# Patient Record
Sex: Female | Born: 1965 | Race: Asian | Hispanic: No | Smoking: Former smoker
Health system: Southern US, Community
[De-identification: ages and names within clinical notes are randomized; demographics above are authoritative.]

## PROBLEM LIST (undated history)

## (undated) DIAGNOSIS — M479 Spondylosis, unspecified: Secondary | ICD-10-CM

## (undated) DIAGNOSIS — K2100 Gastro-esophageal reflux disease with esophagitis, without bleeding: Secondary | ICD-10-CM

## (undated) DIAGNOSIS — F32A Depression, unspecified: Secondary | ICD-10-CM

## (undated) DIAGNOSIS — I10 Essential (primary) hypertension: Secondary | ICD-10-CM

## (undated) DIAGNOSIS — M549 Dorsalgia, unspecified: Secondary | ICD-10-CM

## (undated) DIAGNOSIS — E785 Hyperlipidemia, unspecified: Secondary | ICD-10-CM

## (undated) DIAGNOSIS — G4700 Insomnia, unspecified: Secondary | ICD-10-CM

## (undated) DIAGNOSIS — T7840XA Allergy, unspecified, initial encounter: Secondary | ICD-10-CM

## (undated) DIAGNOSIS — F329 Major depressive disorder, single episode, unspecified: Secondary | ICD-10-CM

## (undated) DIAGNOSIS — K219 Gastro-esophageal reflux disease without esophagitis: Secondary | ICD-10-CM

## (undated) DIAGNOSIS — G8929 Other chronic pain: Secondary | ICD-10-CM

## (undated) HISTORY — DX: Spondylosis, unspecified: M47.9

## (undated) HISTORY — DX: Depression, unspecified: F32.A

## (undated) HISTORY — DX: Essential (primary) hypertension: I10

## (undated) HISTORY — DX: Other chronic pain: G89.29

## (undated) HISTORY — PX: TUBAL LIGATION: SHX77

## (undated) HISTORY — PX: BUNIONECTOMY: SHX129

## (undated) HISTORY — DX: Major depressive disorder, single episode, unspecified: F32.9

## (undated) HISTORY — DX: Hyperlipidemia, unspecified: E78.5

## (undated) HISTORY — DX: Dorsalgia, unspecified: M54.9

## (undated) HISTORY — DX: Gastro-esophageal reflux disease with esophagitis: K21.0

## (undated) HISTORY — PX: ABDOMINAL HYSTERECTOMY: SHX81

## (undated) HISTORY — DX: Gastro-esophageal reflux disease without esophagitis: K21.9

## (undated) HISTORY — DX: Gastro-esophageal reflux disease with esophagitis, without bleeding: K21.00

## (undated) HISTORY — DX: Insomnia, unspecified: G47.00

## (undated) HISTORY — DX: Allergy, unspecified, initial encounter: T78.40XA

---

## 2017-08-04 ENCOUNTER — Ambulatory Visit: Payer: Self-pay | Admitting: Family Medicine

## 2017-08-10 ENCOUNTER — Encounter: Payer: Self-pay | Admitting: Family Medicine

## 2017-08-10 ENCOUNTER — Ambulatory Visit
Admission: RE | Admit: 2017-08-10 | Discharge: 2017-08-10 | Disposition: A | Discharge status: Home / Self Care | Payer: BLUE CROSS/BLUE SHIELD | Source: Ambulatory Visit | Attending: Family Medicine | Admitting: Family Medicine

## 2017-08-10 ENCOUNTER — Ambulatory Visit (INDEPENDENT_AMBULATORY_CARE_PROVIDER_SITE_OTHER): Payer: BLUE CROSS/BLUE SHIELD | Admitting: Family Medicine

## 2017-08-10 VITALS — BP 140/88 | HR 72 | Ht 62.0 in | Wt 164.0 lb

## 2017-08-10 DIAGNOSIS — M5136 Other intervertebral disc degeneration, lumbar region: Secondary | ICD-10-CM | POA: Insufficient documentation

## 2017-08-10 DIAGNOSIS — M5442 Lumbago with sciatica, left side: Principal | ICD-10-CM

## 2017-08-10 DIAGNOSIS — G8929 Other chronic pain: Secondary | ICD-10-CM

## 2017-08-10 DIAGNOSIS — M5441 Lumbago with sciatica, right side: Principal | ICD-10-CM

## 2017-08-10 DIAGNOSIS — K219 Gastro-esophageal reflux disease without esophagitis: Secondary | ICD-10-CM | POA: Diagnosis not present

## 2017-08-10 DIAGNOSIS — M47896 Other spondylosis, lumbar region: Secondary | ICD-10-CM | POA: Diagnosis not present

## 2017-08-10 DIAGNOSIS — M160 Bilateral primary osteoarthritis of hip: Secondary | ICD-10-CM | POA: Diagnosis not present

## 2017-08-10 DIAGNOSIS — I10 Essential (primary) hypertension: Secondary | ICD-10-CM | POA: Diagnosis not present

## 2017-08-10 DIAGNOSIS — F32A Depression, unspecified: Secondary | ICD-10-CM

## 2017-08-10 DIAGNOSIS — Z7689 Persons encountering health services in other specified circumstances: Secondary | ICD-10-CM

## 2017-08-10 DIAGNOSIS — F329 Major depressive disorder, single episode, unspecified: Secondary | ICD-10-CM

## 2017-08-10 DIAGNOSIS — E785 Hyperlipidemia, unspecified: Secondary | ICD-10-CM | POA: Diagnosis not present

## 2017-08-10 MED ORDER — FENOFIBRATE 160 MG PO TABS: 160.0000 mg | ORAL_TABLET | Freq: Every day | ORAL | 1 refills | Status: DC

## 2017-08-10 MED ORDER — BUPROPION HCL ER (SR) 150 MG PO TB12: 150.0000 mg | ORAL_TABLET | Freq: Two times a day (BID) | ORAL | 1 refills | Status: DC

## 2017-08-10 MED ORDER — GABAPENTIN 300 MG PO CAPS: 300.0000 mg | ORAL_CAPSULE | Freq: Three times a day (TID) | ORAL | 1 refills | Status: DC

## 2017-08-10 MED ORDER — OMEPRAZOLE 40 MG PO CPDR: 40.0000 mg | DELAYED_RELEASE_CAPSULE | Freq: Every day | ORAL | 1 refills | Status: DC

## 2017-08-10 MED ORDER — LISINOPRIL 5 MG PO TABS: 5.0000 mg | ORAL_TABLET | Freq: Every day | ORAL | 1 refills | Status: DC

## 2017-08-10 NOTE — Progress Notes (Addendum)
Name: Joan Stein   MRN: [id number]    DOB: Jun 09, 1966   Date:08/11/2017       Progress Note  Subjective  Chief Complaint  Chief Complaint  Patient presents with  . Establish Care    new to area  . Back Pain    needs referral to ortho    Back Pain  This is a chronic problem. The current episode started more than 1 year ago (>20 years). The problem occurs constantly. The problem has been gradually improving since onset. The pain is present in the lumbar spine. The quality of the pain is described as aching. The pain radiates to the left knee, right knee, left thigh and right thigh. The pain is moderate. The symptoms are aggravated by bending. Associated symptoms include bladder incontinence, paresis and tingling. Pertinent negatives include no abdominal pain, bowel incontinence, chest pain, dysuria, fever, headaches, leg pain, numbness, paresthesias, pelvic pain, perianal numbness, weakness or weight loss. Risk factors include menopause. She has tried analgesics for the symptoms. The treatment provided moderate relief.  Depression         This is a chronic problem.  The current episode started more than 1 year ago.   The onset quality is sudden.   The problem occurs intermittently.  The problem has been gradually improving since onset.  Associated symptoms include fatigue, helplessness, hopelessness, insomnia and sad.  Associated symptoms include no decreased concentration, not irritable, no decreased interest, no myalgias and no headaches.  Past treatments include other medications (bupropion).  Compliance with treatment is good.  Previous treatment provided moderate relief. Hyperlipidemia  This is a chronic problem. Pertinent negatives include no chest pain, leg pain or myalgias. Current antihyperlipidemic treatment includes fibric acid derivatives. The current treatment provides moderate improvement of lipids. There are no compliance problems.   Hypertension  This is a chronic problem. The  current episode started more than 1 year ago. Associated symptoms include malaise/fatigue. Pertinent negatives include no chest pain, headaches, neck pain or palpitations. There are no associated agents to hypertension. Risk factors for coronary artery disease include dyslipidemia. Past treatments include ACE inhibitors. The current treatment provides moderate improvement. There are no compliance problems.  There is no history of angina, CAD/MI, CVA or heart failure.  Gastroesophageal Reflux  She complains of heartburn. She reports no abdominal pain or no chest pain. The problem has been waxing and waning. The symptoms are aggravated by certain foods (NSAID's). Associated symptoms include fatigue. Pertinent negatives include no anemia, melena or weight loss. She has tried a PPI for the symptoms. The treatment provided moderate relief.  Insomnia  Primary symptoms: no difficulty falling asleep, malaise/fatigue.  PMH includes: depression.    No problem-specific Assessment & Plan notes found for this encounter.   Past Medical History:  Diagnosis Date  . Allergy   . Chronic back pain   . Depression   . GERD (gastroesophageal reflux disease)   . Hyperlipidemia   . Hypertension   . Insomnia   . Reflux esophagitis   . Spondylosis     Past Surgical History:  Procedure Laterality Date  . ABDOMINAL HYSTERECTOMY    . BUNIONECTOMY    . TUBAL LIGATION      Family History  Problem Relation Age of Onset  . Diabetes Father   . Hypertension Father   . Diabetes Paternal Grandmother     Social History   Social History  . Marital status: Divorced    Spouse name: N/A  . Number of  children: N/A  . Years of education: N/A   Occupational History  . Not on file.   Social History Main Topics  . Smoking status: Former Smoker    Types: Cigarettes  . Smokeless tobacco: Never Used  . Alcohol use No  . Drug use: No  . Sexual activity: Yes   Other Topics Concern  . Not on file   Social  History Narrative  . No narrative on file    No Known Allergies  No outpatient prescriptions prior to visit.   No facility-administered medications prior to visit.     Review of Systems  Constitutional: Positive for fatigue and malaise/fatigue. Negative for fever and weight loss.  Cardiovascular: Negative for chest pain and palpitations.  Gastrointestinal: Positive for heartburn. Negative for abdominal pain, bowel incontinence and melena.  Genitourinary: Positive for bladder incontinence. Negative for dysuria and pelvic pain.  Musculoskeletal: Positive for back pain. Negative for myalgias and neck pain.  Neurological: Positive for tingling. Negative for weakness, numbness, headaches and paresthesias.  Psychiatric/Behavioral: Positive for depression. Negative for decreased concentration. The patient has insomnia.      Objective  Vitals:   08/10/17 1546  BP: 140/88  Pulse: 72  Weight: 164 lb (74.4 kg)  Height: 5\' 2"  (1.575 m)    Physical Exam  Constitutional: She is well-developed, well-nourished, and in no distress. She is not irritable. No distress.  HENT:  Head: Normocephalic and atraumatic.  Right Ear: External ear normal.  Left Ear: External ear normal.  Nose: Nose normal.  Mouth/Throat: Oropharynx is clear and moist.  Eyes: Pupils are equal, round, and reactive to light. Conjunctivae and EOM are normal. Right eye exhibits no discharge. Left eye exhibits no discharge.  Neck: Normal range of motion. Neck supple. No JVD present. No thyromegaly present.  Cardiovascular: Normal rate, regular rhythm, normal heart sounds and intact distal pulses.  Exam reveals no gallop and no friction rub.   No murmur heard. Pulmonary/Chest: Effort normal and breath sounds normal. She has no wheezes. She has no rales.  Abdominal: Soft. Bowel sounds are normal. She exhibits no mass. There is no tenderness. There is no guarding.  Musculoskeletal: Normal range of motion. She exhibits no  edema.  Lymphadenopathy:    She has no cervical adenopathy.  Neurological: She is alert.  Skin: Skin is warm and dry. She is not diaphoretic.  Psychiatric: Mood and affect normal.  Nursing note and vitals reviewed.     Assessment & Plan  Problem List Items Addressed This Visit    None    Visit Diagnoses    Establishing care with new doctor, encounter for    -  Primary   Essential hypertension       Relevant Medications   fenofibrate 160 MG tablet   lisinopril (PRINIVIL,ZESTRIL) 5 MG tablet   Other Relevant Orders   Renal Function Panel   Gastroesophageal reflux disease, esophagitis presence not specified       Relevant Medications   omeprazole (PRILOSEC) 40 MG capsule   Hyperlipidemia, unspecified hyperlipidemia type       Relevant Medications   fenofibrate 160 MG tablet   lisinopril (PRINIVIL,ZESTRIL) 5 MG tablet   Other Relevant Orders   Lipid Profile   Depression, unspecified depression type       Relevant Medications   buPROPion (WELLBUTRIN SR) 150 MG 12 hr tablet   Chronic midline low back pain with bilateral sciatica       return to current ortho doctor/if need go to  er for opiate   Relevant Medications   oxyCODONE-acetaminophen (PERCOCET) 10-325 MG tablet   ibuprofen (ADVIL,MOTRIN) 600 MG tablet   buPROPion (WELLBUTRIN SR) 150 MG 12 hr tablet   gabapentin (NEURONTIN) 300 MG capsule   cyclobenzaprine (FLEXERIL) 10 MG tablet   meloxicam (MOBIC) 15 MG tablet   Other Relevant Orders   DG Lumbar Spine Complete (Completed)   Ambulatory referral to Orthopedic Surgery      Meds ordered this encounter  Medications  . buPROPion (WELLBUTRIN SR) 150 MG 12 hr tablet    Sig: Take 1 tablet (150 mg total) by mouth 2 (two) times daily.    Dispense:  60 tablet    Refill:  1  . fenofibrate 160 MG tablet    Sig: Take 1 tablet (160 mg total) by mouth daily.    Dispense:  30 tablet    Refill:  1  . gabapentin (NEURONTIN) 300 MG capsule    Sig: Take 1 capsule (300 mg  total) by mouth 3 (three) times daily.    Dispense:  90 capsule    Refill:  1  . omeprazole (PRILOSEC) 40 MG capsule    Sig: Take 1 capsule (40 mg total) by mouth daily.    Dispense:  30 capsule    Refill:  1  . lisinopril (PRINIVIL,ZESTRIL) 5 MG tablet    Sig: Take 1 tablet (5 mg total) by mouth daily.    Dispense:  30 tablet    Refill:  1  . cyclobenzaprine (FLEXERIL) 10 MG tablet    Sig: Take 1 tablet (10 mg total) by mouth 2 (two) times daily.    Dispense:  30 tablet    Refill:  6  . meloxicam (MOBIC) 15 MG tablet    Sig: Take 1 tablet (15 mg total) by mouth daily.    Dispense:  30 tablet    Refill:  6      Dr. Elizabeth Sauer [facility]  08/11/17

## 2017-08-11 LAB — LIPID PANEL
CHOL/HDL RATIO: 5.5 ratio — AB (ref 0.0–4.4)
Cholesterol, Total: 240 mg/dL — ABNORMAL HIGH (ref 100–199)
HDL: 44 mg/dL (ref >39)
LDL CALC: 166 mg/dL — AB (ref 0–99)
TRIGLYCERIDES: 148 mg/dL (ref 0–149)
VLDL Cholesterol Cal: 30 mg/dL (ref 5–40)

## 2017-08-11 LAB — RENAL FUNCTION PANEL
Albumin: 4.6 g/dL (ref 3.5–5.5)
BUN / CREAT RATIO: 16 (ref 9–23)
BUN: 16 mg/dL (ref 6–24)
CALCIUM: 9.9 mg/dL (ref 8.7–10.2)
CO2: 24 mmol/L (ref 20–29)
CREATININE: 1.02 mg/dL — AB (ref 0.57–1.00)
Chloride: 103 mmol/L (ref 96–106)
GFR, EST AFRICAN AMERICAN: 74 mL/min/{1.73_m2} (ref >59)
GFR, EST NON AFRICAN AMERICAN: 64 mL/min/{1.73_m2} (ref >59)
Glucose: 102 mg/dL — ABNORMAL HIGH (ref 65–99)
Phosphorus: 4.2 mg/dL (ref 2.5–4.5)
Potassium: 4.5 mmol/L (ref 3.5–5.2)
SODIUM: 141 mmol/L (ref 134–144)

## 2017-08-11 MED ORDER — CYCLOBENZAPRINE HCL 10 MG PO TABS: 10.0000 mg | ORAL_TABLET | Freq: Two times a day (BID) | ORAL | 6 refills | Status: DC

## 2017-08-11 MED ORDER — MELOXICAM 15 MG PO TABS: 15.0000 mg | ORAL_TABLET | Freq: Every day | ORAL | 6 refills | Status: DC

## 2017-08-18 ENCOUNTER — Other Ambulatory Visit: Payer: Self-pay | Admitting: Sports Medicine

## 2017-08-18 DIAGNOSIS — M5136 Other intervertebral disc degeneration, lumbar region: Secondary | ICD-10-CM

## 2017-08-25 ENCOUNTER — Ambulatory Visit
Admission: RE | Admit: 2017-08-25 | Discharge: 2017-08-25 | Disposition: A | Discharge status: Home / Self Care | Payer: BLUE CROSS/BLUE SHIELD | Source: Ambulatory Visit | Attending: Sports Medicine | Admitting: Sports Medicine

## 2017-08-25 DIAGNOSIS — M48061 Spinal stenosis, lumbar region without neurogenic claudication: Secondary | ICD-10-CM | POA: Insufficient documentation

## 2017-08-25 DIAGNOSIS — M5136 Other intervertebral disc degeneration, lumbar region: Secondary | ICD-10-CM | POA: Insufficient documentation

## 2017-09-25 ENCOUNTER — Ambulatory Visit: Payer: BLUE CROSS/BLUE SHIELD | Admitting: Pain Medicine

## 2017-10-03 DIAGNOSIS — Z789 Other specified health status: Secondary | ICD-10-CM | POA: Insufficient documentation

## 2017-10-03 DIAGNOSIS — F1190 Opioid use, unspecified, uncomplicated: Secondary | ICD-10-CM | POA: Insufficient documentation

## 2017-10-03 DIAGNOSIS — M899 Disorder of bone, unspecified: Secondary | ICD-10-CM | POA: Insufficient documentation

## 2017-10-03 DIAGNOSIS — Z79899 Other long term (current) drug therapy: Secondary | ICD-10-CM | POA: Insufficient documentation

## 2017-10-03 DIAGNOSIS — Z791 Long term (current) use of non-steroidal anti-inflammatories (NSAID): Secondary | ICD-10-CM | POA: Insufficient documentation

## 2017-10-03 DIAGNOSIS — G894 Chronic pain syndrome: Secondary | ICD-10-CM | POA: Insufficient documentation

## 2017-10-03 NOTE — Progress Notes (Signed)
Patient's Name: Joan Stein  MRN: [id number]  Referring Provider: Diamond Nickel, MD  DOB: [birth year]  PCP: Juline Patch, MD  DOS: 10/04/2017  Note by: Gaspar Cola, MD  Service setting: Ambulatory outpatient  Specialty: Interventional Pain Management  Location: [facility] (AMB) Pain Management Facility  Visit type: Initial Patient Evaluation  Patient type: New Patient   Primary Reason(s) for Visit: Encounter for initial evaluation of one or more chronic problems (new to examiner) potentially causing chronic pain, and posing a threat to normal musculoskeletal function. (Level of risk: High) CC: Back Pain (low left)  HPI  Ms. Joan Stein is a 51 y.o. year old, female patient, who comes today to see [place] for the first time for an initial evaluation of her chronic pain. She has Chronic pain syndrome; Disorder of skeletal system; Pharmacologic therapy; Problems influencing health status; NSAID long-term use; Opiate use; Chronic low back pain (Primary Area of Pain) (Bilateral) (L>R); Chronic hip pain (Secondary Area of Pain) (Bilateral) (L>R); Chronic sacroiliac joint pain (Bilateral) (L>R); Lumbar facet syndrome (Bilateral) (L>R); Chronic thoracic spine pain (Midline); Chronic upper back (Midline); Long term prescription opiate use; Opioid-induced constipation (OIC); Neurogenic pain; and Chronic musculoskeletal pain on their problem list. Today she comes in for evaluation of her Back Pain (low left)  Pain Assessment: Location: Lower, Left Back Radiating: radiates down left leg on the side to knee Onset: More than a month ago Duration: Chronic pain Quality: Aching, Throbbing, Constant Severity: 2 /10 (self-reported pain score)  Note: Reported level is compatible with observation.                         When using our objective Pain Scale, levels between 6 and 10/10 are said to belong in an emergency room, as it progressively worsens from a 6/10, described as severely limiting, requiring emergency care  not usually available at an outpatient pain management facility. At a 6/10 level, communication becomes difficult and requires great effort. Assistance to reach the emergency department may be required. Facial flushing and profuse sweating along with potentially dangerous increases in heart rate and blood pressure will be evident. Effect on ADL:   Timing: Constant Modifying factors: nothing  Onset and Duration: Gradual and Date of onset: 1994 Cause of pain: childhood accident. Severity: Getting worse, NAS-11 at its worse: 10/10, NAS-11 at its best: 4/10 and NAS-11 on the average: 7/10 Timing: Morning, Night, Not influenced by the time of the day, After activity or exercise and After a period of immobility Aggravating Factors: Bending, Climbing, Lifiting, Prolonged sitting, Prolonged standing and Walking uphill Alleviating Factors: Medications, Warm showers or baths and Chiropractic manipulations Associated Problems: Depression, Fatigue, Inability to concentrate, Numbness, Personality changes, Sadness, Swelling, Tingling, Weakness, Pain that wakes patient up and Pain that does not allow patient to sleep Quality of Pain: Aching, Agonizing, Annoying, Burning, Cruel, Deep, Disabling, Distressing, Dreadful, Dull, Exhausting, Feeling of constriction, Getting longer, Heavy, Horrible, Nagging, Pressure-like, Punishing, Sharp, Shooting, Stabbing, Tender, Throbbing, Tingling, Toothache-like and Uncomfortable Previous Examinations or Tests: MRI scan and X-rays Previous Treatments: Chiropractic manipulations, Epidural steroid injections and Narcotic medications  The patient comes into the clinics today for the first time for a chronic pain management evaluation. According to the patient the primary area of pain is that of the lower back, bilaterally, with the left side being worst on the right. The patient denies any prior surgeries, nerve blocks, but does admit to x-rays and MRIs done less than 2 years  ago and  having had some physical therapy proximally 6 years ago which consisted of 3 visits per week 8 weeks.  The second area of pain is that of the hips, bilaterally, but the left being worst on the right. Again the patient denies any prior surgeries but does admit having had some joint injections. She describes that over the past 10 years she has had 3 injections in the last one was done at Dr. Sharlet Salina at the [facility], yesterday. The patient indicates that the area remains still a little numb and today she is having little to no pain. She did get the injection with some steroids. She indicates that the first one that she had lasted for a while, but sig 1 did not help. She was told that the plan by Dr. Sharlet Salina was to do another 3 injections in the next 6 months. The patient indicates having had some x-rays done at a doctor's office admitting. She denies any physical therapy for that hip pain.  The third area of pain is described to be the mid upper back along the midline. She denies a prior surgeries, nerve blocks, physical therapy, but does indicate having had some x-rays done approximately 2 months ago.  The patient indicates that she has been treating her pain with Percocet which she takes 10 mg every 6 hours. This was apparently started over 3 years ago by a Dr. Ricci Barker. She indicates that he is a family physician. She also indicates that when she first started taking the medications she was using the 5 mg pills and this was sufficient by one year later she had to change the 10 mg pills. She indicates that she has been getting the medicines on a monthly basis for the past 3 years. She also denies having stopped the medicine for any significant period time. The last time she took the medication she indicates was last Monday, approximately 3 days ago. She claims to be taking the medication on a when necessary basis. Note: Unfortunately it was not until I reviewed the PMP that I learned about her fentanyl 25  g per hour patch.  According to the patient when she takes the medication of the onset of benefit is approximately 25 minutes with a peak effect at 1 hour, where she gets approximately 80% relief of the pain. The duration of the medicine as 4-5 hours and she indicates that the only side effect that she has is the constipation. In addition to this, she also takes Flexeril 10 mg when necessary at bedtime and gabapentin 900 mg by mouth daily at bedtime. She indicates having tried Motrin, but she gets gastritis.  With regards to the upper back pain, she has noticed that it is associated with immobility and she does much better with movement. She indicates that she was sent here for medication management and also to see if she can get some long-acting nerve block. Further evaluation of the patient reveals that she had some unrealistic expectations and misinformation about nerve blocks. All this was corrected. She was expecting to be completely pain-free and I make sure explained to her that is likely not to be possible.   Today I took the time to provide the patient with information regarding my pain practice. The patient was informed that my practice is divided into two sections: an interventional pain management section, as well as a completely separate and distinct medication management section. I explained that I have procedure days for my interventional therapies, and evaluation  days for follow-ups and medication management. Because of the amount of documentation required during both, they are kept separated. This means that there is the possibility that she may be scheduled for a procedure on one day, and medication management the next. I have also informed her that because of staffing and facility limitations, I no longer take patients for medication management only. To illustrate the reasons for this, I gave the patient the example of surgeons, and how inappropriate it would be to refer a patient to  his/her care, just to write for the post-surgical antibiotics on a surgery done by a different surgeon.   Because interventional pain management is my board-certified specialty, the patient was informed that joining my practice means that they are open to any and all interventional therapies. I made it clear that this does not mean that they will be forced to have any procedures done. What this means is that I believe interventional therapies to be essential part of the diagnosis and proper management of chronic pain conditions. Therefore, patients not interested in these interventional alternatives will be better served under the care of a different practitioner.  The patient was also made aware of my Comprehensive Pain Management Safety Guidelines where by joining my practice, they limit all of their nerve blocks and joint injections to those done by our practice, for as long as we are retained to manage their care.   Historic Controlled Substance Pharmacotherapy Review  PMP and historical list of controlled substances: Fentanyl 25 g per hour patch; oxycodone/APAP 10/325 one tablet by mouth every 6 hours; hydrocodone/APAP 5/325; Highest opioid analgesic regimen found: Oxycodone/APAP 10/325 one tablet by mouth every 6 hours + fentanyl 25 mcg/h patch every 3 days (120 MME/day)  Most recent opioid analgesic: Oxycodone/APAP 10/325 one tablet by mouth every 6 hours + fentanyl 25 mcg/h patch every 3 days (120 MME/day)   Current opioid analgesics: Oxycodone/APAP 10/325 one tablet by mouth every 6 hours + fentanyl 25 mcg/h patch every 3 days (120 MME/day)   Highest recorded MME/day: 120 mg/day MME/day: 120 mg/day Medications: The patient did not bring the medication(s) to the appointment, as requested in our "New Patient Package" Pharmacodynamics: Desired effects: Analgesia: The patient reports >50% benefit. Reported improvement in function: The patient reports medication allows her to accomplish basic  ADLs. Clinically meaningful improvement in function (CMIF): Sustained CMIF goals met Perceived effectiveness: Described as relatively effective, allowing for increase in activities of daily living (ADL) Undesirable effects: Side-effects or Adverse reactions: None reported Historical Monitoring: The patient  reports that she does not use drugs. List of all UDS Test(s): No results found for: MDMA, COCAINSCRNUR, [place], [place], CANNABQUANT, [place], [place] List of other Serum/Urine Drug Screening Test(s):  No results found for: AMPHSCRSER, BARBSCRSER, BENZOSCRSER, COCAINSCRSER, COCAINSCRNUR, PCPSCRSER, PCPQUANT, THCSCRSER, THCU, CANNABQUANT, OPIATESCRSER, OXYSCRSER, PROPOXSCRSER, ETH Historical Background Evaluation: [place] PMP: Six (6) year initial data search conducted.             PMP NARX Score Report:  Narcotic: 350 Sedative: 180 Stimulant: 000 [place] Department of public safety, offender search: Editor, commissioning Information) Non-contributory Risk Assessment Profile: Aberrant behavior: None observed or detected today Risk factors for fatal opioid overdose: None identified today PMP NARX Overdose Risk Score: 500 Fatal overdose hazard ratio (HR): Calculation deferred Non-fatal overdose hazard ratio (HR): Calculation deferred Risk of opioid abuse or dependence: 0.7-3.0% with doses ? 36 MME/day and 6.1-26% with doses ? 120 MME/day. Substance use disorder (SUD) risk level: Pending results of Medical Psychology Evaluation  for SUD Opioid risk tool (ORT) (Total Score): 1 Opioid Risk Tool - 10/04/17 0932      Family History of Substance Abuse   Alcohol  Negative    Illegal Drugs  Negative    Rx Drugs  Negative      Personal History of Substance Abuse   Alcohol  Negative    Illegal Drugs  Negative    Rx Drugs  Negative      Age   Age between 37-45 years   No      History of Preadolescent Sexual Abuse   History of Preadolescent Sexual Abuse  Negative or Female      Psychological Disease    Psychological Disease  Negative    Depression  Positive      Total Score   Opioid Risk Tool Scoring  1    Opioid Risk Interpretation  Low Risk      ORT Scoring interpretation table:  Score <3 = Low Risk for SUD  Score between 4-7 = Moderate Risk for SUD  Score >8 = High Risk for Opioid Abuse   PHQ-2 Depression Scale:  Total score: 0  PHQ-2 Scoring interpretation table: (Score and probability of major depressive disorder)  Score 0 = No depression  Score 1 = 15.4% Probability  Score 2 = 21.1% Probability  Score 3 = 38.4% Probability  Score 4 = 45.5% Probability  Score 5 = 56.4% Probability  Score 6 = 78.6% Probability   PHQ-9 Depression Scale:  Total score: 0  PHQ-9 Scoring interpretation table:  Score 0-4 = No depression  Score 5-9 = Mild depression  Score 10-14 = Moderate depression  Score 15-19 = Moderately severe depression  Score 20-27 = Severe depression (2.4 times higher risk of SUD and 2.89 times higher risk of overuse)   Pharmacologic Plan: Pending ordered tests and/or consults            Initial impression: Pending review of available data and ordered tests.  Meds   Current Outpatient Medications:  .  buPROPion (WELLBUTRIN SR) 150 MG 12 hr tablet, Take 1 tablet (150 mg total) by mouth 2 (two) times daily., Disp: 60 tablet, Rfl: 1 .  cetirizine (ZYRTEC) 10 MG tablet, Take 10 mg by mouth daily., Disp: , Rfl:  .  cyclobenzaprine (FLEXERIL) 10 MG tablet, Take 1 tablet (10 mg total) by mouth 2 (two) times daily., Disp: 30 tablet, Rfl: 6 .  fenofibrate 160 MG tablet, Take 1 tablet (160 mg total) by mouth daily., Disp: 30 tablet, Rfl: 1 .  fluticasone (FLONASE) 50 MCG/ACT nasal spray, Place 1 spray into both nostrils daily as needed. , Disp: , Rfl:  .  gabapentin (NEURONTIN) 300 MG capsule, Take 1 capsule (300 mg total) by mouth 3 (three) times daily., Disp: 90 capsule, Rfl: 1 .  ibuprofen (ADVIL,MOTRIN) 600 MG tablet, Take 600 mg by mouth 3 (three) times daily., Disp: ,  Rfl:  .  lisinopril (PRINIVIL,ZESTRIL) 5 MG tablet, Take 1 tablet (5 mg total) by mouth daily., Disp: 30 tablet, Rfl: 1 .  montelukast (SINGULAIR) 10 MG tablet, Take 10 mg by mouth at bedtime., Disp: , Rfl:  .  omeprazole (PRILOSEC) 40 MG capsule, Take 1 capsule (40 mg total) by mouth daily. (Patient taking differently: Take 40 mg by mouth daily as needed. ), Disp: 30 capsule, Rfl: 1 .  oxyCODONE-acetaminophen (PERCOCET) 10-325 MG tablet, Take 1 tablet by mouth every 8 (eight) hours as needed for pain., Disp: , Rfl:   Imaging  Review  Lumbosacral Imaging: Lumbar MR wo contrast:  Results for orders placed during the hospital encounter of 08/25/17  MR LUMBAR SPINE WO CONTRAST   Narrative CLINICAL DATA:  Chronic low back pain with left leg and hip pain. Leg weakness and numbness.  EXAM: MRI LUMBAR SPINE WITHOUT CONTRAST  TECHNIQUE: Multiplanar, multisequence MR imaging of the lumbar spine was performed. No intravenous contrast was administered.  COMPARISON:  Lumbar spine radiographs 08/10/2017  FINDINGS: Segmentation:  Standard.  Alignment: Straightening of the normal lumbar lordosis. Minimal retrolisthesis of L4 on L5.  Vertebrae: No fracture, suspicious osseous lesion, or significant marrow edema. Degenerative endplate changes at I9-5 and L4-5. Small L4 superior endplate Schmorl's node anteriorly.  Conus medullaris: Extends to the L1-2 level and appears normal.  Paraspinal and other soft tissues: Unremarkable.  Disc levels:  Disc desiccation greatest at L3-4 and L4-5 where there is severe disc space narrowing.  T12-L1:  Minimal endplate spurring without stenosis.  L1-2:  Minimal disc bulging and spurring without stenosis.  L2-3: Mild disc bulging, mild endplate spurring, and mild facet arthrosis without stenosis. Noncompressive 4 mm synovial cyst medial to the right facet joint, within/deep to the ligament.  L3-4: Disc bulging slightly greater to the left results in  mild left neural foraminal stenosis without spinal stenosis.  L4-5: Circumferential disc bulging, broad central disc protrusion, and mild facet arthrosis result in moderate bilateral lateral recess stenosis and mild bilateral neural foraminal stenosis without significant spinal stenosis. Potential L5 nerve root impingement in the lateral recesses.  L5-S1: Disc bulging, left subarticular to foraminal disc protrusion, and mild facet arthrosis result in mild right and moderate left neural foraminal stenosis with potential left L5 nerve root impingement. There is at most minimal left lateral recess narrowing. No spinal stenosis.  IMPRESSION: 1. Lower lumbar disc degeneration, worst at L4-5 where there is moderate bilateral lateral recess and mild neural foraminal stenosis. Potential L5 nerve root impingement. 2. Moderate left neural foraminal stenosis at L5-S1.   Electronically Signed   By: Logan Bores M.D.   On: 08/25/2017 08:50    Lumbar DG (Complete) 4+V:  Results for orders placed during the hospital encounter of 08/10/17  DG Lumbar Spine Complete   Narrative CLINICAL DATA:  Initial evaluation for chronic back pain.  EXAM: LUMBAR SPINE - COMPLETE 4+ VIEW  COMPARISON:  None.  FINDINGS: Five non rib-bearing lumbar type vertebral bodies. Straightening of the normal lumbar lordosis. No listhesis or malalignment. Vertebral body heights maintained. No evidence for acute or chronic fracture. Probable endplate Schmorl's node noted at the anterior margin of the L4 superior endplate. The  Prominent degenerative intervertebral disc space narrowing at L3-4 and L4-5. Bilateral facet arthropathy at L4-5. The  Soft tissues within normal limits.  Degenerative changes noted about the hips bilaterally.  IMPRESSION: 1. Moderate intervertebral disc space narrowing at L3-4 and L4-5. 2. Bilateral facet arthrosis at L4-5. 3. 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   Electronically Signed   By: Jeannine Boga M.D.   On: 08/11/2017 00:54    Complexity Note: Imaging results reviewed. Results shared with Ms. Koral, using Layman's terms.                         ROS  Cardiovascular History: High blood pressure Pulmonary or Respiratory History: Snoring  and Temporary stoppage of breathing during sleep Neurological History: No reported neurological signs or symptoms such as seizures, abnormal skin sensations, urinary and/or  fecal incontinence, being born with an abnormal open spine and/or a tethered spinal cord Review of Past Neurological Studies: No results found for this or any previous visit. Psychological-Psychiatric History: Anxiousness, Depressed and Difficulty sleeping and or falling asleep Gastrointestinal History: Reflux or heatburn Genitourinary History: No reported renal or genitourinary signs or symptoms such as difficulty voiding or producing urine, peeing blood, non-functioning kidney, kidney stones, difficulty emptying the bladder, difficulty controlling the flow of urine, or chronic kidney disease Hematological History: No reported hematological signs or symptoms such as prolonged bleeding, low or poor functioning platelets, bruising or bleeding easily, hereditary bleeding problems, low energy levels due to low hemoglobin or being anemic Endocrine History: High blood sugar requiring insulin (IDDM) Rheumatologic History: No reported rheumatological signs and symptoms such as fatigue, joint pain, tenderness, swelling, redness, heat, stiffness, decreased range of motion, with or without associated rash Musculoskeletal History: Negative for myasthenia gravis, muscular dystrophy, multiple sclerosis or malignant hyperthermia Work History: Working full time  Allergies  Ms. Shur has No Known Allergies.  Laboratory Chemistry  Inflammation Markers (CRP: Acute Phase) (ESR: Chronic Phase) Lab Results   Component Value Date   CRP <0.3 10/04/2017   ESRSEDRATE 2 10/04/2017                 Rheumatology Markers No results found for: Elayne Guerin, [facility]              Renal Function Markers Lab Results  Component Value Date   BUN 17 10/04/2017   CREATININE 0.86 10/04/2017   GFRAA 91 10/04/2017   GFRNONAA 79 10/04/2017                 Hepatic Function Markers Lab Results  Component Value Date   AST 14 10/04/2017   ALBUMIN 5.1 10/04/2017   ALKPHOS 45 10/04/2017                 Electrolytes Lab Results  Component Value Date   NA 139 10/04/2017   K 4.2 10/04/2017   CL 102 10/04/2017   CALCIUM 10.3 (H) 10/04/2017   MG 2.3 10/04/2017   PHOS 4.2 08/10/2017                 Neuropathy Markers Lab Results  Component Value Date   VITAMINB12 417 10/04/2017                 Bone Pathology Markers Lab Results  Component Value Date   25OHVITD1 WILL FOLLOW 10/04/2017   25OHVITD2 WILL FOLLOW 10/04/2017   25OHVITD3 WILL FOLLOW 10/04/2017                 Coagulation Parameters No results found for: INR, LABPROT, APTT, PLT, DDIMER               Cardiovascular Markers No results found for: BNP, CKTOTAL, CKMB, TROPONINI, HGB, HCT               CA Markers No results found for: CEA, CA125, LABCA2               Note: Lab results reviewed.  [place]  Drug: Ms. Dowson  reports that she does not use drugs. Alcohol:  reports that she does not drink alcohol. Tobacco:  reports that she has quit smoking. Her smoking use included cigarettes. she has never used smokeless tobacco. Medical:  has a past medical history of Allergy, Chronic back pain, Depression, GERD (gastroesophageal reflux disease), Hyperlipidemia, Hypertension, Insomnia, Reflux esophagitis,  and Spondylosis. Family: family history includes Arthritis in her mother; Diabetes in her father and paternal grandmother; Hypertension in her father.  Past Surgical History:  Procedure Laterality Date  .  ABDOMINAL HYSTERECTOMY    . BUNIONECTOMY    . TUBAL LIGATION     Active Ambulatory Problems    Diagnosis Date Noted  . Chronic pain syndrome 10/03/2017  . Disorder of skeletal system 10/03/2017  . Pharmacologic therapy 10/03/2017  . Problems influencing health status 10/03/2017  . NSAID long-term use 10/03/2017  . Opiate use 10/03/2017  . Chronic low back pain (Primary Area of Pain) (Bilateral) (L>R) 10/04/2017  . Chronic hip pain (Secondary Area of Pain) (Bilateral) (L>R) 10/04/2017  . Chronic sacroiliac joint pain (Bilateral) (L>R) 10/04/2017  . Lumbar facet syndrome (Bilateral) (L>R) 10/04/2017  . Chronic thoracic spine pain (Midline) 10/04/2017  . Chronic upper back (Midline) 10/04/2017  . Long term prescription opiate use 10/04/2017  . Opioid-induced constipation (OIC) 10/04/2017  . Neurogenic pain 10/04/2017  . Chronic musculoskeletal pain 10/04/2017   Resolved Ambulatory Problems    Diagnosis Date Noted  . No Resolved Ambulatory Problems   Past Medical History:  Diagnosis Date  . Allergy   . Chronic back pain   . Depression   . GERD (gastroesophageal reflux disease)   . Hyperlipidemia   . Hypertension   . Insomnia   . Reflux esophagitis   . Spondylosis    Constitutional Exam  General appearance: Well nourished, well developed, and well hydrated. In no apparent acute distress Vitals:   10/04/17 0923  BP: (!) 147/89  Pulse: 92  Resp: 16  Temp: 98.7 F (37.1 C)  SpO2: 100%  Weight: 170 lb (77.1 kg)  Height: '5\' 2"'$  (1.575 m)   BMI Assessment: Estimated body mass index is 31.09 kg/m as calculated from the following:   Height as of this encounter: '5\' 2"'$  (1.575 m).   Weight as of this encounter: 170 lb (77.1 kg).  BMI interpretation table: BMI level Category Range association with higher incidence of chronic pain  <18 kg/m2 Underweight   18.5-24.9 kg/m2 Ideal body weight   25-29.9 kg/m2 Overweight Increased incidence by 20%  30-34.9 kg/m2 Obese (Class I)  Increased incidence by 68%  35-39.9 kg/m2 Severe obesity (Class II) Increased incidence by 136%  >40 kg/m2 Extreme obesity (Class III) Increased incidence by 254%   BMI Readings from Last 4 Encounters:  10/04/17 31.09 kg/m  08/10/17 30.00 kg/m   Wt Readings from Last 4 Encounters:  10/04/17 170 lb (77.1 kg)  08/10/17 164 lb (74.4 kg)  Psych/Mental status: Alert, oriented x 3 (person, place, & time)       Eyes: PERLA Respiratory: No evidence of acute respiratory distress  Cervical Spine Area Exam  Skin & Axial Inspection: No masses, redness, edema, swelling, or associated skin lesions Alignment: Symmetrical Functional ROM: Unrestricted ROM      Stability: No instability detected Muscle Tone/Strength: Functionally intact. No obvious neuro-muscular anomalies detected. Sensory (Neurological): Unimpaired Palpation: No palpable anomalies              Upper Extremity (UE) Exam    Side: Right upper extremity  Side: Left upper extremity  Skin & Extremity Inspection: Skin color, temperature, and hair growth are WNL. No peripheral edema or cyanosis. No masses, redness, swelling, asymmetry, or associated skin lesions. No contractures.  Skin & Extremity Inspection: Skin color, temperature, and hair growth are WNL. No peripheral edema or cyanosis. No masses, redness, swelling, asymmetry, or associated skin  lesions. No contractures.  Functional ROM: Unrestricted ROM          Functional ROM: Unrestricted ROM          Muscle Tone/Strength: Functionally intact. No obvious neuro-muscular anomalies detected.  Muscle Tone/Strength: Functionally intact. No obvious neuro-muscular anomalies detected.  Sensory (Neurological): Unimpaired          Sensory (Neurological): Unimpaired          Palpation: No palpable anomalies              Palpation: No palpable anomalies              Specialized Test(s): Deferred         Specialized Test(s): Deferred          Thoracic Spine Area Exam  Skin & Axial Inspection:  No masses, redness, or swelling Alignment: Symmetrical Functional ROM: Unrestricted ROM Stability: No instability detected Muscle Tone/Strength: Functionally intact. No obvious neuro-muscular anomalies detected. Sensory (Neurological): Unimpaired Muscle strength & Tone: No palpable anomalies  Lumbar Spine Area Exam  Skin & Axial Inspection: No masses, redness, or swelling Alignment: Symmetrical Functional ROM: Unrestricted ROM      Stability: No instability detected Muscle Tone/Strength: Functionally intact. No obvious neuro-muscular anomalies detected. Sensory (Neurological): Unimpaired Palpation: No palpable anomalies       Provocative Tests: Lumbar Hyperextension and rotation test: evaluation deferred today       Lumbar Lateral bending test: evaluation deferred today       Patrick's Maneuver: evaluation deferred today                    Gait & Posture Assessment  Ambulation: Unassisted Gait: Relatively normal for age and body habitus Posture: WNL   Lower Extremity Exam    Side: Right lower extremity  Side: Left lower extremity  Skin & Extremity Inspection: Skin color, temperature, and hair growth are WNL. No peripheral edema or cyanosis. No masses, redness, swelling, asymmetry, or associated skin lesions. No contractures.  Skin & Extremity Inspection: Skin color, temperature, and hair growth are WNL. No peripheral edema or cyanosis. No masses, redness, swelling, asymmetry, or associated skin lesions. No contractures.  Functional ROM: Unrestricted ROM          Functional ROM: Unrestricted ROM          Muscle Tone/Strength: Functionally intact. No obvious neuro-muscular anomalies detected.  Muscle Tone/Strength: Functionally intact. No obvious neuro-muscular anomalies detected.  Sensory (Neurological): Unimpaired  Sensory (Neurological): Unimpaired  Palpation: No palpable anomalies  Palpation: No palpable anomalies   Assessment  Primary Diagnosis & Pertinent Problem  List: Diagnoses of Chronic pain syndrome, Chronic low back pain (Primary Area of Pain) (Bilateral) (L>R), Chronic hip pain (Secondary Area of Pain) (Bilateral) (L>R), Chronic thoracic spine pain (Midline), Chronic upper back (Midline), Chronic sacroiliac joint pain (Bilateral) (L>R), Lumbar facet syndrome (Bilateral) (L>R), Disorder of skeletal system, Chronic musculoskeletal pain, Neurogenic pain, Pharmacologic therapy, Problems influencing health status, NSAID long-term use, Long term prescription opiate use, Opiate use, and Opioid-induced constipation (OIC) were pertinent to this visit.  Visit Diagnosis (New problems to examiner): 1. Chronic pain syndrome   2. Chronic low back pain (Primary Area of Pain) (Bilateral) (L>R)   3. Chronic hip pain (Secondary Area of Pain) (Bilateral) (L>R)   4. Chronic thoracic spine pain (Midline)   5. Chronic upper back (Midline)   6. Chronic sacroiliac joint pain (Bilateral) (L>R)   7. Lumbar facet syndrome (Bilateral) (L>R)   8. Disorder of skeletal system  9. Chronic musculoskeletal pain   10. Neurogenic pain   11. Pharmacologic therapy   12. Problems influencing health status   13. NSAID long-term use   14. Long term prescription opiate use   15. Opiate use   16. Opioid-induced constipation (OIC)    Plan of Care (Initial workup plan)  Note: Ms. Uplinger was reminded that as per protocol, today's visit has been an evaluation only. We have not taken over the patient's controlled substance management.  Problem-specific plan: No problem-specific Assessment & Plan notes found for this encounter.   Lab Orders     Compliance Drug Analysis, Ur     Comp. Metabolic Panel (12)     Magnesium     Vitamin B12     Sedimentation rate     25-Hydroxyvitamin D Lcms D2+D3     C-reactive protein  Imaging Orders     DG HIP UNILAT W OR W/O PELVIS 2-3 VIEWS RIGHT     DG HIP UNILAT W OR W/O PELVIS 2-3 VIEWS LEFT     DG Si Joints     DG Thoracic Spine 2  View  Referral Orders     Ambulatory referral to Psychology Procedure Orders    No procedure(s) ordered today   Pharmacotherapy (current): Medications ordered:  No orders of the defined types were placed in this encounter.  Medications administered during this visit: Lacresha Fusilier had no medications administered during this visit.   Pharmacological management options:  Opioid Analgesics: The patient was informed that there is no guarantee that she would be a candidate for opioid analgesics. The decision will be made following CDC guidelines. This decision will be based on the results of diagnostic studies, as well as Ms. Behlke's risk profile.   Membrane stabilizer: To be determined at a later time  Muscle relaxant: To be determined at a later time  NSAID: To be determined at a later time  Other analgesic(s): To be determined at a later time   Interventional management options: Ms. Blacksher was informed that there is no guarantee that she would be a candidate for interventional therapies. The decision will be based on the results of diagnostic studies, as well as Ms. Antonellis's risk profile.  Procedure(s) under consideration:  Diagnostic bilateral lumbar facet block  Possible bilateral lumbar facet RFA  Diagnostic bilateral sacroiliac joint block  Possible bilateral sacroiliac joint RFA  Diagnostic bilateral intra-articular hip joint injection  Diagnostic bilateral femoral nerve + obturator nerve block  Possible bilateral femoral nerve + obturator nerve RFA    Provider-requested follow-up: Return for 2nd Visit (Post-MedPsych eval).  No future appointments.  Primary Care Physician: Juline Patch, MD Location: [facility] Note by: Gaspar Cola, MD Date: 10/04/2017; Time: 7:29 PM

## 2017-10-04 ENCOUNTER — Encounter: Payer: Self-pay | Admitting: Pain Medicine

## 2017-10-04 ENCOUNTER — Ambulatory Visit
Admission: RE | Admit: 2017-10-04 | Discharge: 2017-10-04 | Disposition: A | Discharge status: Home / Self Care | Payer: BLUE CROSS/BLUE SHIELD | Source: Ambulatory Visit | Attending: Pain Medicine | Admitting: Pain Medicine

## 2017-10-04 ENCOUNTER — Other Ambulatory Visit: Payer: Self-pay

## 2017-10-04 ENCOUNTER — Ambulatory Visit: Payer: BLUE CROSS/BLUE SHIELD | Attending: Pain Medicine | Admitting: Pain Medicine

## 2017-10-04 DIAGNOSIS — Z8261 Family history of arthritis: Secondary | ICD-10-CM | POA: Insufficient documentation

## 2017-10-04 DIAGNOSIS — Z8249 Family history of ischemic heart disease and other diseases of the circulatory system: Secondary | ICD-10-CM | POA: Insufficient documentation

## 2017-10-04 DIAGNOSIS — G4700 Insomnia, unspecified: Secondary | ICD-10-CM | POA: Diagnosis not present

## 2017-10-04 DIAGNOSIS — F1190 Opioid use, unspecified, uncomplicated: Secondary | ICD-10-CM

## 2017-10-04 DIAGNOSIS — M5134 Other intervertebral disc degeneration, thoracic region: Secondary | ICD-10-CM | POA: Diagnosis not present

## 2017-10-04 DIAGNOSIS — M533 Sacrococcygeal disorders, not elsewhere classified: Secondary | ICD-10-CM

## 2017-10-04 DIAGNOSIS — M25551 Pain in right hip: Principal | ICD-10-CM

## 2017-10-04 DIAGNOSIS — R937 Abnormal findings on diagnostic imaging of other parts of musculoskeletal system: Secondary | ICD-10-CM | POA: Diagnosis not present

## 2017-10-04 DIAGNOSIS — Z79899 Other long term (current) drug therapy: Secondary | ICD-10-CM | POA: Diagnosis not present

## 2017-10-04 DIAGNOSIS — M47816 Spondylosis without myelopathy or radiculopathy, lumbar region: Secondary | ICD-10-CM | POA: Diagnosis not present

## 2017-10-04 DIAGNOSIS — T402X5A Adverse effect of other opioids, initial encounter: Secondary | ICD-10-CM | POA: Diagnosis not present

## 2017-10-04 DIAGNOSIS — I10 Essential (primary) hypertension: Secondary | ICD-10-CM | POA: Insufficient documentation

## 2017-10-04 DIAGNOSIS — M5136 Other intervertebral disc degeneration, lumbar region: Secondary | ICD-10-CM

## 2017-10-04 DIAGNOSIS — M25552 Pain in left hip: Secondary | ICD-10-CM | POA: Diagnosis present

## 2017-10-04 DIAGNOSIS — Z90710 Acquired absence of both cervix and uterus: Secondary | ICD-10-CM | POA: Insufficient documentation

## 2017-10-04 DIAGNOSIS — M545 Low back pain, unspecified: Secondary | ICD-10-CM

## 2017-10-04 DIAGNOSIS — F329 Major depressive disorder, single episode, unspecified: Secondary | ICD-10-CM | POA: Diagnosis not present

## 2017-10-04 DIAGNOSIS — M546 Pain in thoracic spine: Secondary | ICD-10-CM

## 2017-10-04 DIAGNOSIS — Z87891 Personal history of nicotine dependence: Secondary | ICD-10-CM | POA: Diagnosis not present

## 2017-10-04 DIAGNOSIS — Z79891 Long term (current) use of opiate analgesic: Secondary | ICD-10-CM | POA: Diagnosis not present

## 2017-10-04 DIAGNOSIS — M488X6 Other specified spondylopathies, lumbar region: Secondary | ICD-10-CM | POA: Insufficient documentation

## 2017-10-04 DIAGNOSIS — M25752 Osteophyte, left hip: Secondary | ICD-10-CM | POA: Insufficient documentation

## 2017-10-04 DIAGNOSIS — M792 Neuralgia and neuritis, unspecified: Secondary | ICD-10-CM | POA: Insufficient documentation

## 2017-10-04 DIAGNOSIS — Z833 Family history of diabetes mellitus: Secondary | ICD-10-CM | POA: Diagnosis not present

## 2017-10-04 DIAGNOSIS — M549 Dorsalgia, unspecified: Secondary | ICD-10-CM

## 2017-10-04 DIAGNOSIS — Z789 Other specified health status: Secondary | ICD-10-CM

## 2017-10-04 DIAGNOSIS — K219 Gastro-esophageal reflux disease without esophagitis: Secondary | ICD-10-CM | POA: Diagnosis not present

## 2017-10-04 DIAGNOSIS — M25751 Osteophyte, right hip: Secondary | ICD-10-CM | POA: Diagnosis not present

## 2017-10-04 DIAGNOSIS — K5903 Drug induced constipation: Secondary | ICD-10-CM | POA: Diagnosis not present

## 2017-10-04 DIAGNOSIS — M47818 Spondylosis without myelopathy or radiculopathy, sacral and sacrococcygeal region: Secondary | ICD-10-CM | POA: Insufficient documentation

## 2017-10-04 DIAGNOSIS — G894 Chronic pain syndrome: Secondary | ICD-10-CM | POA: Insufficient documentation

## 2017-10-04 DIAGNOSIS — G8929 Other chronic pain: Secondary | ICD-10-CM

## 2017-10-04 DIAGNOSIS — M899 Disorder of bone, unspecified: Secondary | ICD-10-CM

## 2017-10-04 DIAGNOSIS — E785 Hyperlipidemia, unspecified: Secondary | ICD-10-CM | POA: Diagnosis not present

## 2017-10-04 DIAGNOSIS — Z791 Long term (current) use of non-steroidal anti-inflammatories (NSAID): Secondary | ICD-10-CM | POA: Diagnosis not present

## 2017-10-04 DIAGNOSIS — M7918 Myalgia, other site: Secondary | ICD-10-CM | POA: Insufficient documentation

## 2017-10-04 NOTE — Progress Notes (Signed)
Safety precautions to be maintained throughout the outpatient stay will include: orient to surroundings, keep bed in low position, maintain call bell within reach at all times, provide assistance with transfer out of bed and ambulation.  

## 2017-10-04 NOTE — Patient Instructions (Addendum)
________________________You have been instructed to get labs and xrays and med psych eval..  Call when you have completed all of these and make a return appointment.__________________________________________________________________  Pain Scale  Introduction: The pain score used by this practice is the Verbal Numerical Rating Scale (VNRS-11). This is an 11-point scale. It is for adults and children 10 years or older. There are significant differences in how the pain score is reported, used, and applied. Forget everything you learned in the past and learn this scoring system.  General Information: The scale should reflect your current level of pain. Unless you are specifically asked for the level of your worst pain, or your average pain. If you are asked for one of these two, then it should be understood that it is over the past 24 hours.  Basic Activities of Daily Living (ADL): Personal hygiene, dressing, eating, transferring, and using restroom.  Instructions: Most patients tend to report their level of pain as a combination of two factors, their physical pain and their psychosocial pain. This last one is also known as "suffering" and it is reflection of how physical pain affects you socially and psychologically. From now on, report them separately. From this point on, when asked to report your pain level, report only your physical pain. Use the following table for reference.  Pain Clinic Pain Levels (0-5/10)  Pain Level Score  Description  No Pain 0   Mild pain 1 Nagging, annoying, but does not interfere with basic activities of daily living (ADL). Patients are able to eat, bathe, get dressed, toileting (being able to get on and off the toilet and perform personal hygiene functions), transfer (move in and out of bed or a chair without assistance), and maintain continence (able to control bladder and bowel functions). Blood pressure and heart rate are unaffected. A normal heart rate for a healthy  adult ranges from 60 to 100 bpm (beats per minute).   Mild to moderate pain 2 Noticeable and distracting. Impossible to hide from other people. More frequent flare-ups. Still possible to adapt and function close to normal. It can be very annoying and may have occasional stronger flare-ups. With discipline, patients may get used to it and adapt.   Moderate pain 3 Interferes significantly with activities of daily living (ADL). It becomes difficult to feed, bathe, get dressed, get on and off the toilet or to perform personal hygiene functions. Difficult to get in and out of bed or a chair without assistance. Very distracting. With effort, it can be ignored when deeply involved in activities.   Moderately severe pain 4 Impossible to ignore for more than a few minutes. With effort, patients may still be able to manage work or participate in some social activities. Very difficult to concentrate. Signs of autonomic nervous system discharge are evident: dilated pupils (mydriasis); mild sweating (diaphoresis); sleep interference. Heart rate becomes elevated (>115 bpm). Diastolic blood pressure (lower number) rises above 100 mmHg. Patients find relief in laying down and not moving.   Severe pain 5 Intense and extremely unpleasant. Associated with frowning face and frequent crying. Pain overwhelms the senses.  Ability to do any activity or maintain social relationships becomes significantly limited. Conversation becomes difficult. Pacing back and forth is common, as getting into a comfortable position is nearly impossible. Pain wakes you up from deep sleep. Physical signs will be obvious: pupillary dilation; increased sweating; goosebumps; brisk reflexes; cold, clammy hands and feet; nausea, vomiting or dry heaves; loss of appetite; significant sleep disturbance with inability  to fall asleep or to remain asleep. When persistent, significant weight loss is observed due to the complete loss of appetite and sleep  deprivation.  Blood pressure and heart rate becomes significantly elevated. Caution: If elevated blood pressure triggers a pounding headache associated with blurred vision, then the patient should immediately seek attention at an urgent or emergency care unit, as these may be signs of an impending stroke.    Emergency Department Pain Levels (6-10/10)  Emergency Room Pain 6 Severely limiting. Requires emergency care and should not be seen or managed at an outpatient pain management facility. Communication becomes difficult and requires great effort. Assistance to reach the emergency department may be required. Facial flushing and profuse sweating along with potentially dangerous increases in heart rate and blood pressure will be evident.   Distressing pain 7 Self-care is very difficult. Assistance is required to transport, or use restroom. Assistance to reach the emergency department will be required. Tasks requiring coordination, such as bathing and getting dressed become very difficult.   Disabling pain 8 Self-care is no longer possible. At this level, pain is disabling. The individual is unable to do even the most "basic" activities such as walking, eating, bathing, dressing, transferring to a bed, or toileting. Fine motor skills are lost. It is difficult to think clearly.   Incapacitating pain 9 Pain becomes incapacitating. Thought processing is no longer possible. Difficult to remember your own name. Control of movement and coordination are lost.   The worst pain imaginable 10 At this level, most patients pass out from pain. When this level is reached, collapse of the autonomic nervous system occurs, leading to a sudden drop in blood pressure and heart rate. This in turn results in a temporary and dramatic drop in blood flow to the brain, leading to a loss of consciousness. Fainting is one of the body's self defense mechanisms. Passing out puts the brain in a calmed state and causes it to shut down  for a while, in order to begin the healing process.    Summary: 1. Refer to this scale when providing [place] with your pain level. 2. Be accurate and careful when reporting your pain level. This will help with your care. 3. Over-reporting your pain level will lead to loss of credibility. 4. Even a level of 1/10 means that there is pain and will be treated at our facility. 5. High, inaccurate reporting will be documented as "Symptom Exaggeration", leading to loss of credibility and suspicions of possible secondary gains such as obtaining more narcotics, or wanting to appear disabled, for fraudulent reasons. 6. Only pain levels of 5 or below will be seen at our facility. 7. Pain levels of 6 and above will be sent to the Emergency Department and the appointment cancelled. ____________________________________________________________________________________________    ____________________________________________________________________________________________  Appointment Policy Summary  It is our goal and responsibility to provide the medical community with assistance in the evaluation and management of patients with chronic pain. Unfortunately our resources are limited. Because we do not have an unlimited amount of time, or available appointments, we are required to closely monitor and manage their use. The following rules exist to maximize their use:  Patient's responsibilities: 1. Punctuality:  At what time should I arrive? You should be physically present in our office 30 minutes before your scheduled appointment. Your scheduled appointment is with your assigned healthcare provider. However, it takes 5-10 minutes to be "checked-in", and another 15 minutes for the nurses to do the admission. If you arrive  to our office at the time you were given for your appointment, you will end up being at least 20-25 minutes late to your appointment with the provider. 2. Tardiness:  What happens if I arrive  only a few minutes after my scheduled appointment time? You will need to reschedule your appointment. The cutoff is your appointment time. This is why it is so important that you arrive at least 30 minutes before that appointment. If you have an appointment scheduled for 10:00 AM and you arrive at 10:01, you will be required to reschedule your appointment.  3. Plan ahead:  Always assume that you will encounter traffic on your way in. Plan for it. If you are dependent on a driver, make sure they understand these rules and the need to arrive early. 4. Other appointments and responsibilities:  Avoid scheduling any other appointments before or after your pain clinic appointments.  5. Be prepared:  Write down everything that you need to discuss with your healthcare provider and give this information to the admitting nurse. Write down the medications that you will need refilled. Bring your pills and bottles (even the empty ones), to all of your appointments, except for those where a procedure is scheduled. 6. No children or pets:  Find someone to take care of them. It is not appropriate to bring them in. 7. Scheduling changes:  We request "advanced notification" of any changes or cancellations. 8. Advanced notification:  Defined as a time period of more than 24 hours prior to the originally scheduled appointment. This allows for the appointment to be offered to other patients. 9. Rescheduling:  When a visit is rescheduled, it will require the cancellation of the original appointment. For this reason they both fall within the category of "Cancellations".  10. Cancellations:  They require advanced notification. Any cancellation less than 24 hours before the  appointment will be recorded as a "No Show". 11. No Show:  Defined as an unkept appointment where the patient failed to notify or declare to the practice their intention or inability to keep the appointment.  Corrective process for repeat offenders:   1. Tardiness: Three (3) episodes of rescheduling due to late arrivals will be recorded as one (1) "No Show". 2. Cancellation or reschedule: Three (3) cancellations or rescheduling will be recorded as one (1) "No Show". 3. "No Shows": Three (3) "No Shows" within a 12 month period will result in discharge from the practice.  ____________________________________________________________________________________________  ____________________________________________________________________________________________  DRUG HOLIDAYS  What is a "Drug Holiday"? Drug Holiday: is the name given to the period of time during which a patient stops taking a medication(s) for the purpose of eliminating tolerance to the drug.  Benefits . Improved effectiveness of opioids. . Decreased opioid dose needed to achieve benefits. . Improved pain with lesser dose.  What is tolerance? Tolerance: is the progressive decreased in effectiveness of a drug due to its repetitive use. With repetitive use, the body gets use to the medication and as a consequence, it loses its effectiveness. This is a common problem seen with opioid pain medications. As a result, a larger dose of the drug is needed to achieve the same effect that used to be obtained with a smaller dose.  How long should a "Drug Holiday" last? At least 14 consecutive days. (2 weeks)  What are withdrawals? Withdrawals: refers to the wide range of symptoms that occur after stopping or dramatically reducing opiate drugs after heavy and prolonged use. Withdrawal symptoms do not occur to  patients that use low dose opioids, or those who take the medication sporadically. Contrary to benzodiazepine (example: Valium, Xanax, etc.) or alcohol withdrawals ("Delirium Tremens"), opioid withdrawals are not lethal. Withdrawals are the physical manifestation of the body getting rid of the excess receptors.  Expected Symptoms Early symptoms of withdrawal may  include: . Agitation . Anxiety . Muscle aches . Increased tearing . Insomnia . Runny nose . Sweating . Yawning  Late symptoms of withdrawal may include: . Abdominal cramping . Diarrhea . Dilated pupils . Goose bumps . Nausea . Vomiting  Will I experience withdrawals? Due to the slow nature of the taper, it is very unlikely that you will experience any.  What is a slow taper? Taper: refers to the gradual decrease in dose. ___________________________________________________________________________________________

## 2017-10-05 DIAGNOSIS — M47816 Spondylosis without myelopathy or radiculopathy, lumbar region: Secondary | ICD-10-CM | POA: Insufficient documentation

## 2017-10-05 DIAGNOSIS — M5136 Other intervertebral disc degeneration, lumbar region: Secondary | ICD-10-CM | POA: Insufficient documentation

## 2017-10-07 LAB — COMP. METABOLIC PANEL (12)
A/G RATIO: 2.4 — AB (ref 1.2–2.2)
ALK PHOS: 45 IU/L (ref 39–117)
AST: 14 IU/L (ref 0–40)
Albumin: 5.1 g/dL (ref 3.5–5.5)
BILIRUBIN TOTAL: 0.2 mg/dL (ref 0.0–1.2)
BUN/Creatinine Ratio: 20 (ref 9–23)
BUN: 17 mg/dL (ref 6–24)
CALCIUM: 10.3 mg/dL — AB (ref 8.7–10.2)
CHLORIDE: 102 mmol/L (ref 96–106)
Creatinine, Ser: 0.86 mg/dL (ref 0.57–1.00)
GFR calc Af Amer: 91 mL/min/{1.73_m2} (ref >59)
GFR calc non Af Amer: 79 mL/min/{1.73_m2} (ref >59)
GLOBULIN, TOTAL: 2.1 g/dL (ref 1.5–4.5)
Glucose: 82 mg/dL (ref 65–99)
POTASSIUM: 4.2 mmol/L (ref 3.5–5.2)
Sodium: 139 mmol/L (ref 134–144)
Total Protein: 7.2 g/dL (ref 6.0–8.5)

## 2017-10-07 LAB — 25-HYDROXY VITAMIN D LCMS D2+D3
25-Hydroxy, Vitamin D-2: <1 ng/mL
25-Hydroxy, Vitamin D-3: 16 ng/mL

## 2017-10-07 LAB — MAGNESIUM: MAGNESIUM: 2.3 mg/dL (ref 1.6–2.3)

## 2017-10-07 LAB — C-REACTIVE PROTEIN: CRP: <0.3 mg/L (ref 0.0–4.9)

## 2017-10-07 LAB — 25-HYDROXYVITAMIN D LCMS D2+D3: 25-HYDROXY, VITAMIN D: 16 ng/mL — AB

## 2017-10-07 LAB — SEDIMENTATION RATE: SED RATE: 2 mm/h (ref 0–40)

## 2017-10-07 LAB — VITAMIN B12: VITAMIN B 12: 417 pg/mL (ref 232–1245)

## 2017-10-12 LAB — COMPLIANCE DRUG ANALYSIS, UR

## 2017-12-18 ENCOUNTER — Encounter: Payer: Self-pay | Admitting: Family Medicine

## 2017-12-18 ENCOUNTER — Ambulatory Visit: Payer: BLUE CROSS/BLUE SHIELD | Admitting: Family Medicine

## 2017-12-18 VITALS — BP 130/80 | HR 72 | Ht 62.0 in | Wt 166.0 lb

## 2017-12-18 DIAGNOSIS — N761 Subacute and chronic vaginitis: Secondary | ICD-10-CM

## 2017-12-18 DIAGNOSIS — J324 Chronic pansinusitis: Secondary | ICD-10-CM

## 2017-12-18 DIAGNOSIS — J309 Allergic rhinitis, unspecified: Secondary | ICD-10-CM | POA: Diagnosis not present

## 2017-12-18 MED ORDER — FLUCONAZOLE 150 MG PO TABS: 150.0000 mg | ORAL_TABLET | Freq: Once | ORAL | 0 refills | Status: AC

## 2017-12-18 MED ORDER — AMOXICILLIN-POT CLAVULANATE 875-125 MG PO TABS
1.0000 | ORAL_TABLET | Freq: Two times a day (BID) | ORAL | 0 refills | Status: DC
Start: 2017-12-18 — End: 2018-01-10

## 2017-12-18 NOTE — Patient Instructions (Signed)

## 2017-12-18 NOTE — Progress Notes (Signed)
Name: Joan Stein   MRN: [id number]    DOB: 11/14/1965   Date:12/19/2017       Progress Note  Subjective  Chief Complaint  Chief Complaint  Patient presents with  . Sinusitis    went to urgent care and was treated with flu x 1 month ago- is having sinus issues again- wants referral to ENT  . Vaginal Discharge    itching    Sinusitis  This is a recurrent problem. The current episode started 1 to 4 weeks ago (4 weeks). The problem has been gradually worsening since onset. Maximum temperature: low grade. Her pain is at a severity of 8/10. The pain is moderate. Associated symptoms include chills, congestion, diaphoresis, headaches, sinus pressure and sneezing. Pertinent negatives include no coughing, ear pain, hoarse voice, neck pain, shortness of breath, sore throat or swollen glands. (Facial pain) Past treatments include oral decongestants (flonase).  Vaginal Discharge  The patient's primary symptoms include genital itching and vaginal discharge. The patient's pertinent negatives include no genital lesions, genital odor, genital rash or vaginal bleeding. This is a new problem. The current episode started in the past 7 days. The problem has been waxing and waning. The pain is moderate. Associated symptoms include chills and headaches. Pertinent negatives include no abdominal pain, back pain, constipation, diarrhea, dysuria, fever, frequency, hematuria, joint pain, nausea, rash, sore throat or urgency. She is postmenopausal. Her past medical history is significant for a gynecological surgery.  URI   This is a chronic (allergies/to see ENT) problem. The current episode started more than 1 year ago. The problem has been waxing and waning. Associated symptoms include congestion, headaches, rhinorrhea, sinus pain and sneezing. Pertinent negatives include no abdominal pain, chest pain, coughing, diarrhea, dysuria, ear pain, joint pain, nausea, neck pain, rash, sore throat, swollen glands or wheezing.  Associated symptoms comments: olefactory odor. She has tried decongestant and antihistamine for the symptoms. The treatment provided mild relief.    No problem-specific Assessment & Plan notes found for this encounter.   Past Medical History:  Diagnosis Date  . Allergy   . Chronic back pain   . Depression   . GERD (gastroesophageal reflux disease)   . Hyperlipidemia   . Hypertension   . Insomnia   . Reflux esophagitis   . Spondylosis     Past Surgical History:  Procedure Laterality Date  . ABDOMINAL HYSTERECTOMY    . BUNIONECTOMY    . TUBAL LIGATION      Family History  Problem Relation Age of Onset  . Diabetes Father   . Hypertension Father   . Diabetes Paternal Grandmother   . Arthritis Mother     Social History   Socioeconomic History  . Marital status: Divorced    Spouse name: Not on file  . Number of children: Not on file  . Years of education: Not on file  . Highest education level: Not on file  Social Needs  . Financial resource strain: Not on file  . Food insecurity - worry: Not on file  . Food insecurity - inability: Not on file  . Transportation needs - medical: Not on file  . Transportation needs - non-medical: Not on file  Occupational History  . Not on file  Tobacco Use  . Smoking status: Former Smoker    Types: Cigarettes  . Smokeless tobacco: Never Used  Substance and Sexual Activity  . Alcohol use: No  . Drug use: No  . Sexual activity: Yes  Other Topics Concern  .  Not on file  Social History Narrative  . Not on file    No Known Allergies  Outpatient Medications Prior to Visit  Medication Sig Dispense Refill  . buPROPion (WELLBUTRIN SR) 150 MG 12 hr tablet Take 1 tablet (150 mg total) by mouth 2 (two) times daily. 60 tablet 1  . cetirizine (ZYRTEC) 10 MG tablet Take 10 mg by mouth daily.    . cyclobenzaprine (FLEXERIL) 10 MG tablet Take 1 tablet (10 mg total) by mouth 2 (two) times daily. 30 tablet 6  . fenofibrate 160 MG tablet  Take 1 tablet (160 mg total) by mouth daily. 30 tablet 1  . fluticasone (FLONASE) 50 MCG/ACT nasal spray Place 1 spray into both nostrils daily as needed.     . gabapentin (NEURONTIN) 300 MG capsule Take 1 capsule (300 mg total) by mouth 3 (three) times daily. 90 capsule 1  . ibuprofen (ADVIL,MOTRIN) 600 MG tablet Take 600 mg by mouth 3 (three) times daily.    Marland Kitchen lisinopril (PRINIVIL,ZESTRIL) 5 MG tablet Take 1 tablet (5 mg total) by mouth daily. 30 tablet 1  . montelukast (SINGULAIR) 10 MG tablet Take 10 mg by mouth at bedtime.    Marland Kitchen omeprazole (PRILOSEC) 40 MG capsule Take 1 capsule (40 mg total) by mouth daily. (Patient taking differently: Take 40 mg by mouth daily as needed. ) 30 capsule 1  . oxyCODONE-acetaminophen (PERCOCET) 10-325 MG tablet Take 1 tablet by mouth every 8 (eight) hours as needed for pain.     No facility-administered medications prior to visit.     Review of Systems  Constitutional: Positive for chills and diaphoresis. Negative for fever, malaise/fatigue and weight loss.  HENT: Positive for congestion, rhinorrhea, sinus pressure, sinus pain and sneezing. Negative for ear discharge, ear pain, hoarse voice and sore throat.   Eyes: Negative for blurred vision.  Respiratory: Negative for cough, sputum production, shortness of breath and wheezing.   Cardiovascular: Negative for chest pain, palpitations and leg swelling.  Gastrointestinal: Negative for abdominal pain, blood in stool, constipation, diarrhea, heartburn, melena and nausea.  Genitourinary: Positive for vaginal discharge. Negative for dysuria, frequency, hematuria and urgency.  Musculoskeletal: Negative for back pain, joint pain, myalgias and neck pain.  Skin: Negative for rash.  Neurological: Positive for headaches. Negative for dizziness, tingling, sensory change and focal weakness.  Endo/Heme/Allergies: Negative for environmental allergies and polydipsia. Does not bruise/bleed easily.  Psychiatric/Behavioral:  Negative for depression and suicidal ideas. The patient is not nervous/anxious and does not have insomnia.      Objective  Vitals:   12/18/17 1602  BP: 130/80  Pulse: 72  Weight: 166 lb (75.3 kg)  Height: 5\' 2"  (1.575 m)    Physical Exam  Constitutional: She is well-developed, well-nourished, and in no distress. No distress.  HENT:  Head: Normocephalic and atraumatic.  Right Ear: Tympanic membrane, external ear and ear canal normal.  Left Ear: Tympanic membrane, external ear and ear canal normal.  Nose: Mucosal edema present. Right sinus exhibits frontal sinus tenderness. Left sinus exhibits frontal sinus tenderness.  Mouth/Throat: Oropharynx is clear and moist. No oropharyngeal exudate, posterior oropharyngeal edema or posterior oropharyngeal erythema.  Eyes: Conjunctivae and EOM are normal. Pupils are equal, round, and reactive to light. Right eye exhibits no discharge. Left eye exhibits no discharge.  Neck: Normal range of motion. Neck supple. No JVD present. No thyromegaly present.  Cardiovascular: Normal rate, regular rhythm, normal heart sounds and intact distal pulses. Exam reveals no gallop and no friction rub.  No murmur heard. Pulmonary/Chest: Effort normal and breath sounds normal. She has no wheezes. She has no rales.  Abdominal: Soft. Bowel sounds are normal. She exhibits no mass. There is no tenderness. There is no guarding.  Musculoskeletal: Normal range of motion. She exhibits no edema.  Lymphadenopathy:    She has no cervical adenopathy.  Neurological: She is alert. She has normal reflexes.  Skin: Skin is warm and dry. She is not diaphoretic.  Psychiatric: Mood and affect normal.  Nursing note and vitals reviewed.     Assessment & Plan  Problem List Items Addressed This Visit    None    Visit Diagnoses    Chronic pansinusitis    -  Primary   with acute exacerbation   Relevant Medications   amoxicillin-clavulanate (AUGMENTIN) 875-125 MG tablet   Other  Relevant Orders   Ambulatory referral to ENT   Allergic sinusitis       Relevant Medications   amoxicillin-clavulanate (AUGMENTIN) 875-125 MG tablet   Other Relevant Orders   Ambulatory referral to ENT   Subacute vaginitis          Meds ordered this encounter  Medications  . amoxicillin-clavulanate (AUGMENTIN) 875-125 MG tablet    Sig: Take 1 tablet by mouth 2 (two) times daily.    Dispense:  20 tablet    Refill:  0  . fluconazole (DIFLUCAN) 150 MG tablet    Sig: Take 1 tablet (150 mg total) by mouth once for 1 dose.    Dispense:  2 tablet    Refill:  0      Dr. Elizabeth Sauer [facility]  12/19/17

## 2018-01-10 ENCOUNTER — Telehealth: Payer: Self-pay | Admitting: Family Medicine

## 2018-01-10 ENCOUNTER — Other Ambulatory Visit: Payer: Self-pay

## 2018-01-10 ENCOUNTER — Encounter: Payer: Self-pay | Admitting: Family Medicine

## 2018-01-10 ENCOUNTER — Ambulatory Visit (INDEPENDENT_AMBULATORY_CARE_PROVIDER_SITE_OTHER): Payer: BLUE CROSS/BLUE SHIELD | Admitting: Family Medicine

## 2018-01-10 VITALS — BP 130/94 | HR 84 | Ht 62.0 in | Wt 173.0 lb

## 2018-01-10 DIAGNOSIS — T464X5A Adverse effect of angiotensin-converting-enzyme inhibitors, initial encounter: Secondary | ICD-10-CM | POA: Diagnosis not present

## 2018-01-10 DIAGNOSIS — F329 Major depressive disorder, single episode, unspecified: Secondary | ICD-10-CM | POA: Diagnosis not present

## 2018-01-10 DIAGNOSIS — I10 Essential (primary) hypertension: Secondary | ICD-10-CM

## 2018-01-10 DIAGNOSIS — M5442 Lumbago with sciatica, left side: Secondary | ICD-10-CM

## 2018-01-10 DIAGNOSIS — G8929 Other chronic pain: Secondary | ICD-10-CM | POA: Diagnosis not present

## 2018-01-10 DIAGNOSIS — E785 Hyperlipidemia, unspecified: Secondary | ICD-10-CM | POA: Diagnosis not present

## 2018-01-10 DIAGNOSIS — R058 Other specified cough: Secondary | ICD-10-CM

## 2018-01-10 DIAGNOSIS — F32A Depression, unspecified: Secondary | ICD-10-CM

## 2018-01-10 DIAGNOSIS — Z2089 Contact with and (suspected) exposure to other communicable diseases: Secondary | ICD-10-CM | POA: Diagnosis not present

## 2018-01-10 DIAGNOSIS — R05 Cough: Secondary | ICD-10-CM | POA: Diagnosis not present

## 2018-01-10 DIAGNOSIS — K219 Gastro-esophageal reflux disease without esophagitis: Secondary | ICD-10-CM | POA: Diagnosis not present

## 2018-01-10 DIAGNOSIS — Z207 Contact with and (suspected) exposure to pediculosis, acariasis and other infestations: Secondary | ICD-10-CM

## 2018-01-10 DIAGNOSIS — M5441 Lumbago with sciatica, right side: Secondary | ICD-10-CM

## 2018-01-10 MED ORDER — OMEPRAZOLE 40 MG PO CPDR: 40.0000 mg | DELAYED_RELEASE_CAPSULE | Freq: Every day | ORAL | 1 refills | Status: DC

## 2018-01-10 MED ORDER — PERMETHRIN 5 % EX CREA: 1.0000 "application " | TOPICAL_CREAM | Freq: Once | CUTANEOUS | 0 refills | Status: AC

## 2018-01-10 MED ORDER — BUPROPION HCL ER (SR) 150 MG PO TB12: 150.0000 mg | ORAL_TABLET | Freq: Two times a day (BID) | ORAL | 1 refills | Status: DC

## 2018-01-10 MED ORDER — GABAPENTIN 300 MG PO CAPS: 300.0000 mg | ORAL_CAPSULE | Freq: Three times a day (TID) | ORAL | 1 refills | Status: DC

## 2018-01-10 MED ORDER — LOSARTAN POTASSIUM 50 MG PO TABS: 50.0000 mg | ORAL_TABLET | Freq: Every day | ORAL | 1 refills | Status: DC

## 2018-01-10 MED ORDER — FENOFIBRATE 160 MG PO TABS: 160.0000 mg | ORAL_TABLET | Freq: Every day | ORAL | 1 refills | Status: DC

## 2018-01-10 NOTE — Telephone Encounter (Signed)
Patient would like to know if she needs to schedule a wellness visit or physical.

## 2018-01-10 NOTE — Progress Notes (Signed)
Name: Joan Stein   MRN: [id number]    DOB: Sep 29, 1966   Date:01/10/2018       Progress Note  Subjective  Chief Complaint  Chief Complaint  Patient presents with  . Rash    daughter and grandkids are being treated for scabies- she is itching, especially at night and have some places that look like bites  . Consult    allergy Dr suggested switching her lisinopril to a different med due to cough    Rash  This is a new problem. The current episode started more than 1 year ago. The problem has been waxing and waning since onset. The rash is diffuse. The rash is characterized by itchiness and redness. She was exposed to nothing. Pertinent negatives include no anorexia, congestion, cough, diarrhea, eye pain, facial edema, fatigue, fever, joint pain, nail changes, rhinorrhea, shortness of breath, sore throat or vomiting. Past treatments include nothing. The treatment provided moderate relief. Her past medical history is significant for allergies.  Cough  This is a chronic problem. The current episode started more than 1 month ago. The problem has been waxing and waning. The cough is non-productive. Associated symptoms include a rash. Pertinent negatives include no chest pain, chills, ear pain, fever, headaches, heartburn, myalgias, rhinorrhea, sore throat, shortness of breath, weight loss or wheezing. The symptoms are aggravated by pollens. She has tried nothing for the symptoms. The treatment provided moderate relief. There is no history of environmental allergies.    No problem-specific Assessment & Plan notes found for this encounter.   Past Medical History:  Diagnosis Date  . Allergy   . Chronic back pain   . Depression   . GERD (gastroesophageal reflux disease)   . Hyperlipidemia   . Hypertension   . Insomnia   . Reflux esophagitis   . Spondylosis     Past Surgical History:  Procedure Laterality Date  . ABDOMINAL HYSTERECTOMY    . BUNIONECTOMY    . TUBAL LIGATION      Family  History  Problem Relation Age of Onset  . Diabetes Father   . Hypertension Father   . Diabetes Paternal Grandmother   . Arthritis Mother     Social History   Socioeconomic History  . Marital status: Divorced    Spouse name: Not on file  . Number of children: Not on file  . Years of education: Not on file  . Highest education level: Not on file  Occupational History  . Not on file  Social Needs  . Financial resource strain: Not on file  . Food insecurity:    Worry: Not on file    Inability: Not on file  . Transportation needs:    Medical: Not on file    Non-medical: Not on file  Tobacco Use  . Smoking status: Former Smoker    Types: Cigarettes  . Smokeless tobacco: Never Used  Substance and Sexual Activity  . Alcohol use: No  . Drug use: No  . Sexual activity: Yes  Lifestyle  . Physical activity:    Days per week: Not on file    Minutes per session: Not on file  . Stress: Not on file  Relationships  . Social connections:    Talks on phone: Not on file    Gets together: Not on file    Attends religious service: Not on file    Active member of club or organization: Not on file    Attends meetings of clubs or organizations: Not on  file    Relationship status: Not on file  . Intimate partner violence:    Fear of current or ex partner: Not on file    Emotionally abused: Not on file    Physically abused: Not on file    Forced sexual activity: Not on file  Other Topics Concern  . Not on file  Social History Narrative  . Not on file    No Known Allergies  Outpatient Medications Prior to Visit  Medication Sig Dispense Refill  . cetirizine (ZYRTEC) 10 MG tablet Take 10 mg by mouth daily.    . fluticasone (FLONASE) 50 MCG/ACT nasal spray Place 1 spray into both nostrils daily as needed.     Marland Kitchen. ibuprofen (ADVIL,MOTRIN) 600 MG tablet Take 600 mg by mouth 3 (three) times daily.    . montelukast (SINGULAIR) 10 MG tablet Take 10 mg by mouth at bedtime.    Marland Kitchen. buPROPion  (WELLBUTRIN SR) 150 MG 12 hr tablet Take 1 tablet (150 mg total) by mouth 2 (two) times daily. 60 tablet 1  . fenofibrate 160 MG tablet Take 1 tablet (160 mg total) by mouth daily. 30 tablet 1  . gabapentin (NEURONTIN) 300 MG capsule Take 1 capsule (300 mg total) by mouth 3 (three) times daily. 90 capsule 1  . lisinopril (PRINIVIL,ZESTRIL) 5 MG tablet Take 1 tablet (5 mg total) by mouth daily. 30 tablet 1  . omeprazole (PRILOSEC) 40 MG capsule Take 1 capsule (40 mg total) by mouth daily. (Patient taking differently: Take 40 mg by mouth daily as needed. ) 30 capsule 1  . cyclobenzaprine (FLEXERIL) 10 MG tablet Take 1 tablet (10 mg total) by mouth 2 (two) times daily. (Patient not taking: Reported on 01/10/2018) 30 tablet 6  . amoxicillin-clavulanate (AUGMENTIN) 875-125 MG tablet Take 1 tablet by mouth 2 (two) times daily. 20 tablet 0  . oxyCODONE-acetaminophen (PERCOCET) 10-325 MG tablet Take 1 tablet by mouth every 8 (eight) hours as needed for pain.     No facility-administered medications prior to visit.     Review of Systems  Constitutional: Negative for chills, fatigue, fever, malaise/fatigue and weight loss.  HENT: Negative for congestion, ear discharge, ear pain, rhinorrhea and sore throat.   Eyes: Negative for blurred vision and pain.  Respiratory: Negative for cough, sputum production, shortness of breath and wheezing.   Cardiovascular: Negative for chest pain, palpitations and leg swelling.  Gastrointestinal: Negative for abdominal pain, anorexia, blood in stool, constipation, diarrhea, heartburn, melena, nausea and vomiting.  Genitourinary: Negative for dysuria, frequency, hematuria and urgency.  Musculoskeletal: Negative for back pain, joint pain, myalgias and neck pain.  Skin: Positive for rash. Negative for nail changes.  Neurological: Negative for dizziness, tingling, sensory change, focal weakness and headaches.  Endo/Heme/Allergies: Negative for environmental allergies and  polydipsia. Does not bruise/bleed easily.  Psychiatric/Behavioral: Negative for depression and suicidal ideas. The patient is not nervous/anxious and does not have insomnia.      Objective  Vitals:   01/10/18 1005  BP: (!) 130/94  Pulse: 84  Weight: 173 lb (78.5 kg)  Height: 5\' 2"  (1.575 m)    Physical Exam  Constitutional: She is well-developed, well-nourished, and in no distress. No distress.  HENT:  Head: Normocephalic and atraumatic.  Right Ear: External ear normal.  Left Ear: External ear normal.  Nose: Nose normal.  Mouth/Throat: Oropharynx is clear and moist.  Eyes: Pupils are equal, round, and reactive to light. Conjunctivae and EOM are normal. Right eye exhibits no discharge. Left  eye exhibits no discharge.  Neck: Normal range of motion. Neck supple. No JVD present. No thyromegaly present.  Cardiovascular: Normal rate, regular rhythm, normal heart sounds and intact distal pulses. Exam reveals no gallop and no friction rub.  No murmur heard. Pulmonary/Chest: Effort normal and breath sounds normal. She has no wheezes. She has no rales.  Abdominal: Soft. Bowel sounds are normal. She exhibits no mass. There is no tenderness. There is no guarding.  Musculoskeletal: Normal range of motion. She exhibits no edema.  Lymphadenopathy:    She has no cervical adenopathy.  Neurological: She is alert. She has normal reflexes.  Skin: Skin is warm and dry. She is not diaphoretic.  Psychiatric: Mood and affect normal.  Nursing note and vitals reviewed.     Assessment & Plan  Problem List Items Addressed This Visit    None    Visit Diagnoses    Scabies exposure    -  Primary   Essential hypertension       Relevant Medications   losartan (COZAAR) 50 MG tablet   fenofibrate 160 MG tablet   Cough due to ACE inhibitor       Hyperlipidemia, unspecified hyperlipidemia type       Relevant Medications   losartan (COZAAR) 50 MG tablet   fenofibrate 160 MG tablet   Chronic midline  low back pain with bilateral sciatica       return to current ortho doctor/if need go to er for opiate   Relevant Medications   gabapentin (NEURONTIN) 300 MG capsule   buPROPion (WELLBUTRIN SR) 150 MG 12 hr tablet   Depression, unspecified depression type       Relevant Medications   buPROPion (WELLBUTRIN SR) 150 MG 12 hr tablet   Gastroesophageal reflux disease, esophagitis presence not specified       Relevant Medications   omeprazole (PRILOSEC) 40 MG capsule      Meds ordered this encounter  Medications  . permethrin (ELIMITE) 5 % cream    Sig: Apply 1 application topically once for 1 dose.    Dispense:  1 g    Refill:  0  . losartan (COZAAR) 50 MG tablet    Sig: Take 1 tablet (50 mg total) by mouth daily.    Dispense:  30 tablet    Refill:  1  . fenofibrate 160 MG tablet    Sig: Take 1 tablet (160 mg total) by mouth daily.    Dispense:  30 tablet    Refill:  1  . gabapentin (NEURONTIN) 300 MG capsule    Sig: Take 1 capsule (300 mg total) by mouth 3 (three) times daily.    Dispense:  90 capsule    Refill:  1  . buPROPion (WELLBUTRIN SR) 150 MG 12 hr tablet    Sig: Take 1 tablet (150 mg total) by mouth 2 (two) times daily.    Dispense:  60 tablet    Refill:  1  . omeprazole (PRILOSEC) 40 MG capsule    Sig: Take 1 capsule (40 mg total) by mouth daily.    Dispense:  30 capsule    Refill:  1      Dr. Elizabeth Sauer [facility]  01/10/18

## 2018-01-11 NOTE — Telephone Encounter (Signed)
CPE with Yetta BarreJones IF she does not have a GYN dr

## 2018-01-20 ENCOUNTER — Encounter: Payer: Self-pay | Admitting: Emergency Medicine

## 2018-01-20 ENCOUNTER — Other Ambulatory Visit: Payer: Self-pay

## 2018-01-20 ENCOUNTER — Emergency Department
Admission: EM | Admit: 2018-01-20 | Discharge: 2018-01-20 | Disposition: A | Discharge status: Home / Self Care | Payer: BLUE CROSS/BLUE SHIELD | Attending: Emergency Medicine | Admitting: Emergency Medicine

## 2018-01-20 DIAGNOSIS — Z87891 Personal history of nicotine dependence: Secondary | ICD-10-CM | POA: Insufficient documentation

## 2018-01-20 DIAGNOSIS — M7918 Myalgia, other site: Secondary | ICD-10-CM | POA: Diagnosis present

## 2018-01-20 DIAGNOSIS — R52 Pain, unspecified: Secondary | ICD-10-CM

## 2018-01-20 DIAGNOSIS — J324 Chronic pansinusitis: Secondary | ICD-10-CM | POA: Diagnosis not present

## 2018-01-20 DIAGNOSIS — Z79899 Other long term (current) drug therapy: Secondary | ICD-10-CM | POA: Diagnosis not present

## 2018-01-20 DIAGNOSIS — I10 Essential (primary) hypertension: Secondary | ICD-10-CM | POA: Diagnosis not present

## 2018-01-20 LAB — COMPREHENSIVE METABOLIC PANEL
ALK PHOS: 33 U/L — AB (ref 38–126)
ALT: 11 U/L — ABNORMAL LOW (ref 14–54)
ANION GAP: 6 (ref 5–15)
AST: 15 U/L (ref 15–41)
Albumin: 4.1 g/dL (ref 3.5–5.0)
BUN: 13 mg/dL (ref 6–20)
CALCIUM: 9.1 mg/dL (ref 8.9–10.3)
CHLORIDE: 105 mmol/L (ref 101–111)
CO2: 25 mmol/L (ref 22–32)
Creatinine, Ser: 0.77 mg/dL (ref 0.44–1.00)
GFR calc Af Amer: >60 mL/min (ref >60)
GFR calc non Af Amer: >60 mL/min (ref >60)
Glucose, Bld: 93 mg/dL (ref 65–99)
POTASSIUM: 4.1 mmol/L (ref 3.5–5.1)
SODIUM: 136 mmol/L (ref 135–145)
Total Bilirubin: 0.5 mg/dL (ref 0.3–1.2)
Total Protein: 6.6 g/dL (ref 6.5–8.1)

## 2018-01-20 LAB — CBC WITH DIFFERENTIAL/PLATELET
BASOS PCT: 1 %
Basophils Absolute: 0.1 10*3/uL (ref 0–0.1)
EOS ABS: 0.2 10*3/uL (ref 0–0.7)
EOS PCT: 2 %
HCT: 36.6 % (ref 35.0–47.0)
HEMOGLOBIN: 12 g/dL (ref 12.0–16.0)
LYMPHS ABS: 1.8 10*3/uL (ref 1.0–3.6)
Lymphocytes Relative: 24 %
MCH: 28.5 pg (ref 26.0–34.0)
MCHC: 32.8 g/dL (ref 32.0–36.0)
MCV: 87 fL (ref 80.0–100.0)
MONOS PCT: 8 %
Monocytes Absolute: 0.6 10*3/uL (ref 0.2–0.9)
NEUTROS PCT: 65 %
Neutro Abs: 5 10*3/uL (ref 1.4–6.5)
PLATELETS: 292 10*3/uL (ref 150–440)
RBC: 4.21 MIL/uL (ref 3.80–5.20)
RDW: 13.7 % (ref 11.5–14.5)
WBC: 7.6 10*3/uL (ref 3.6–11.0)

## 2018-01-20 LAB — URINALYSIS, COMPLETE (UACMP) WITH MICROSCOPIC
BACTERIA UA: NONE SEEN
BILIRUBIN URINE: NEGATIVE
Glucose, UA: NEGATIVE mg/dL
Hgb urine dipstick: NEGATIVE
KETONES UR: NEGATIVE mg/dL
LEUKOCYTES UA: NEGATIVE
NITRITE: NEGATIVE
PH: 6 (ref 5.0–8.0)
PROTEIN: NEGATIVE mg/dL
Specific Gravity, Urine: 1.013 (ref 1.005–1.030)

## 2018-01-20 LAB — CK: Total CK: 79 U/L (ref 38–234)

## 2018-01-20 MED ORDER — METAXALONE 800 MG PO TABS: 800.0000 mg | ORAL_TABLET | Freq: Every evening | ORAL | 0 refills | Status: DC | PRN

## 2018-01-20 MED ORDER — SODIUM CHLORIDE 0.9 % IV BOLUS
1000.0000 mL | Freq: Once | INTRAVENOUS | Status: AC
  Administered 2018-01-20: 1000 mL via INTRAVENOUS

## 2018-01-20 NOTE — ED Notes (Signed)
Pt states that she is taken levofloxacin for an URI and bronchitis. States the medication is making her have joint pain. Has been on the medication for about a week.  She is going to see ENT this Wednesday.  States this is the second abx she has taken for her URI.

## 2018-01-20 NOTE — ED Triage Notes (Signed)
States was put on Levaquin on 3/26 for bronchitis. States 5 days ago began muscle aches. Took med until yesterday. Coming now due to concern muscle aches may be reaction to Levaquin.

## 2018-01-20 NOTE — ED Notes (Signed)
Per lab, they will add on CK to tubes previously drawn.

## 2018-01-20 NOTE — ED Provider Notes (Signed)
[facility] Emergency Department Provider Note  ____________________________________________  Time seen: Approximately 4:10 PM  I have reviewed the triage vital signs and the nursing notes.   HISTORY  Chief Complaint Medication Reaction    HPI Joan Stein is a 52 y.o. female who presents emergency department complaining of generalized body aches.  Patient reports that she has had ongoing pansinusitis which has just undergone his second treatment of antibiotics from ENT.  Patient reports that she was started on Augmentin, then placed on steroids, then placed on Levaquin for her chronic pansinusitis.  Patient states that she has taken 11 days worth of the antibiotics but is now experiencing increasing muscle pain.  Patient reports that symptoms were first noticed 5 days ago but she attributed this to increased activity while preparing for a yard sale.  Patient is on her feet and walks constantly for her job.  Patient reports that over the last 2 days she has developed increasing left ankle pain.  No signs of trauma.  She denies any changes in her gait.  Patient reports that the pain was severe last night but slightly improved today.  Patient was placed on fluoroquinolone antibiotics approximately 30 years ago with a gastrocnemius tear.  Patient was concerned that she may have done the same thing or was experiencing tendinopathy from her Levaquin.  Patient is on chronic NSAIDs, opioids, gabapentin.  Patient denies any headache, visual changes, URI symptoms (with the exception of her pansinusitis which is unchanged from baseline), chest pain, shortness of breath, abdominal pain, nausea vomiting, diarrhea.  Patient has chronic constipation from her opioids.  No changes from baseline.  No dysuria, polyuria, hematuria.  Past Medical History:  Diagnosis Date  . Allergy   . Chronic back pain   . Depression   . GERD (gastroesophageal reflux disease)   . Hyperlipidemia   .  Hypertension   . Insomnia   . Reflux esophagitis   . Spondylosis     Patient Active Problem List   Diagnosis Date Noted  . DDD (degenerative disc disease), lumbar 10/05/2017  . Lumbar facet arthropathy (L4-5) (Bilateral) 10/05/2017  . Lumbar facet osteoarthritis 10/05/2017  . Osteoarthritis of lumbar spine 10/05/2017  . Chronic low back pain (Primary Area of Pain) (Bilateral) (L>R) 10/04/2017  . Chronic hip pain (Secondary Area of Pain) (Bilateral) (L>R) 10/04/2017  . Chronic sacroiliac joint pain (Bilateral) (L>R) 10/04/2017  . Lumbar facet syndrome (Bilateral) (L>R) 10/04/2017  . Chronic thoracic spine pain (Midline) 10/04/2017  . Chronic upper back (Midline) 10/04/2017  . Long term prescription opiate use 10/04/2017  . Opioid-induced constipation (OIC) 10/04/2017  . Neurogenic pain 10/04/2017  . Chronic musculoskeletal pain 10/04/2017  . Chronic pain syndrome 10/03/2017  . Disorder of skeletal system 10/03/2017  . Pharmacologic therapy 10/03/2017  . Problems influencing health status 10/03/2017  . NSAID long-term use 10/03/2017  . Opiate use 10/03/2017    Past Surgical History:  Procedure Laterality Date  . ABDOMINAL HYSTERECTOMY    . BUNIONECTOMY    . TUBAL LIGATION      Prior to Admission medications   Medication Sig Start Date End Date Taking? Authorizing Provider  buPROPion (WELLBUTRIN SR) 150 MG 12 hr tablet Take 1 tablet (150 mg total) by mouth 2 (two) times daily. 01/10/18   Duanne Limerick, MD  cetirizine (ZYRTEC) 10 MG tablet Take 10 mg by mouth daily.    [provider]  cyclobenzaprine (FLEXERIL) 10 MG tablet Take 1 tablet (10 mg total) by mouth  2 (two) times daily. Patient not taking: Reported on 01/10/2018 08/11/17   Duanne Limerick, MD  fenofibrate 160 MG tablet Take 1 tablet (160 mg total) by mouth daily. 01/10/18   Duanne Limerick, MD  fluticasone (FLONASE) 50 MCG/ACT nasal spray Place 1 spray into both nostrils daily as needed.     [provider]  gabapentin (NEURONTIN) 300 MG capsule Take 1 capsule (300 mg total) by mouth 3 (three) times daily. 01/10/18   Duanne Limerick, MD  ibuprofen (ADVIL,MOTRIN) 600 MG tablet Take 600 mg by mouth 3 (three) times daily.    [provider]  losartan (COZAAR) 50 MG tablet Take 1 tablet (50 mg total) by mouth daily. 01/10/18   Duanne Limerick, MD  metaxalone (SKELAXIN) 800 MG tablet Take 1 tablet (800 mg total) by mouth at bedtime as needed for muscle spasms. 01/20/18   Soley Harriss, Delorise Royals, PA-C  montelukast (SINGULAIR) 10 MG tablet Take 10 mg by mouth at bedtime.    [provider]  omeprazole (PRILOSEC) 40 MG capsule Take 1 capsule (40 mg total) by mouth daily. 01/10/18   Duanne Limerick, MD    Allergies Patient has no known allergies.  Family History  Problem Relation Age of Onset  . Diabetes Father   . Hypertension Father   . Diabetes Paternal Grandmother   . Arthritis Mother     Social History Social History   Tobacco Use  . Smoking status: Former Smoker    Types: Cigarettes  . Smokeless tobacco: Never Used  Substance Use Topics  . Alcohol use: No  . Drug use: No     Review of Systems  Constitutional: No fever/chills Eyes: No visual changes. No discharge ENT: Chronic pansinusitis, no changes from baseline.. Cardiovascular: no chest pain. Respiratory: no cough. No SOB. Gastrointestinal: No abdominal pain.  No nausea, no vomiting.  No diarrhea.  Positive for chronic opioid-induced constipation. Genitourinary: Negative for dysuria. No hematuria Musculoskeletal: Generalized body aches/muscle aches, worse left ankle Skin: Negative for rash, abrasions, lacerations, ecchymosis. Neurological: Negative for headaches, focal weakness or numbness. 10-point ROS otherwise negative.  ____________________________________________   PHYSICAL EXAM:  VITAL SIGNS: ED Triage Vitals  Enc Vitals Group     BP 01/20/18 1419 (!) 150/98     Pulse Rate 01/20/18  1419 73     Resp 01/20/18 1419 18     Temp 01/20/18 1419 98.2 F (36.8 C)     Temp Source 01/20/18 1419 Oral     SpO2 01/20/18 1419 97 %     Weight 01/20/18 1421 70 lb (31.8 kg)     Height 01/20/18 1421 5\' 2"  (1.575 m)     Head Circumference --      Peak Flow --      Pain Score 01/20/18 1420 7     Pain Loc --      Pain Edu? --      Excl. in GC? --      Constitutional: Alert and oriented. Well appearing and in no acute distress. Eyes: Conjunctivae are normal. PERRL. EOMI. Head: Atraumatic. ENT:      Ears:       Nose: No congestion/rhinnorhea.  Tenderness to percussion of the frontal, ethmoid, maxillary sinuses.      Mouth/Throat: Mucous membranes are moist.  Neck: No stridor.  Neck is supple full range of motion. Hematological/Lymphatic/Immunilogical: No cervical lymphadenopathy. Cardiovascular: Normal rate, regular rhythm. Normal S1 and S2.  Good peripheral circulation. Respiratory: Normal respiratory effort  without tachypnea or retractions. Lungs CTAB. Good air entry to the bases with no decreased or absent breath sounds. Musculoskeletal: Full range of motion to all extremities. No gross deformities appreciated.  Patient with full range of motion all extremities.  No visible deformities, edema, ecchymosis or erythema.  Patient's only tenderness to palpation is over the left lateral ankle.  No tenderness to palpation of the shoulders, arms, upper or lower legs.  No palpable abnormality or deficits to the left ankle.  Palpation along the Achilles tendon does not reveal any tenderness or deficits.  Full range of motion to the left ankle with flexion, extension, internal and external rotation.  Dorsalis pedis pulse intact.  Sensation intact all 5 digits. Neurologic:  Normal speech and language. No gross focal neurologic deficits are appreciated.  Skin:  Skin is warm, dry and intact. No rash noted. Psychiatric: Mood and affect are normal. Speech and behavior are normal. Patient exhibits  appropriate insight and judgement.   ____________________________________________   LABS (all labs ordered are listed, but only abnormal results are displayed)  Labs Reviewed  COMPREHENSIVE METABOLIC PANEL - Abnormal; Notable for the following components:      Result Value   ALT 11 (*)    Alkaline Phosphatase 33 (*)    All other components within normal limits  URINALYSIS, COMPLETE (UACMP) WITH MICROSCOPIC - Abnormal; Notable for the following components:   Color, Urine YELLOW (*)    APPearance CLEAR (*)    Squamous Epithelial / LPF 0-5 (*)    All other components within normal limits  CBC WITH DIFFERENTIAL/PLATELET  CK   ____________________________________________  EKG   ____________________________________________  RADIOLOGY   No results found.  ____________________________________________    PROCEDURES  Procedure(s) performed:    Procedures    Medications  sodium chloride 0.9 % bolus 1,000 mL (0 mLs Intravenous Stopped 01/20/18 1824)     ____________________________________________   INITIAL IMPRESSION / ASSESSMENT AND PLAN / ED COURSE  Pertinent labs & imaging results that were available during my care of the patient were reviewed by me and considered in my medical decision making (see chart for details).  Review of the [place] CSRS was performed in accordance of the [facility] prior to dispensing any controlled drugs.  Clinical Course as of Jan 20 1853  Sat Jan 20, 2018  1636 Patient presents emergency department with generalized muscle aches status post steroid and Levaquin use.  Patient stopped her Levaquin on day 11 out of 14 due to increasing muscle aches.  Patient had history of gastrocnemius tear approximately 30 years ago after taking a regimen of fluoroquinolones.  On exam, exam was reassuring.  I feel that symptoms may be related to both concomitant steroid and fluoroquinolone use with diffuse tendinopathy.  However, patient's increased activity, and  generalized body aches, differential also includes rhabdo, muscle strain, dehydration.  Patient will be evaluated with basic labs and given fluids in the emergency department.   [JC]    Clinical Course User Index [JC] Revere Maahs, Delorise RoyalsJonathan D, PA-C    Patient's diagnosis is consistent with generalized body aches and chronic pansinusitis.  Patient was on 14-day course of Levaquin for chronic pansinusitis.  Patient reports that she developed worsening body aches over the past 5 days.  She was concerned that she had had a previous gastrocnemius tear from fluoroquinolone use 30 years ago.  On exam, patient's exam is reassuring.  With diffuse body aches with additional physical work prior to onset of symptoms, I was also concerned  that patient may be experiencing mild rhabdomyolysis.  Blood work returned with reassuring results.  No indication for further workup at this time.  Patient will be given muscle relaxer to take in addition to her chronic medications for pain.  Patient has stopped at the flora quinolone use.  Patient is advised to increase her oral intake of fluids..  Patient is to follow-up with ENT in 3 days for chronic pansinusitis.  Patient is given ED precautions to return to the ED for any worsening or new symptoms.     ____________________________________________  FINAL CLINICAL IMPRESSION(S) / ED DIAGNOSES  Final diagnoses:  Body aches  Chronic pansinusitis      NEW MEDICATIONS STARTED DURING THIS VISIT:  ED Discharge Orders        Ordered    metaxalone (SKELAXIN) 800 MG tablet  At bedtime PRN     01/20/18 1852          This chart was dictated using voice recognition software/Dragon. Despite best efforts to proofread, errors can occur which can change the meaning. Any change was purely unintentional.    Racheal Patches, PA-C 01/20/18 1854    Phineas Semen, MD 01/20/18 1900

## 2018-01-20 NOTE — ED Notes (Signed)
Pt ambulatory upon discharge. Verbalized understanding of discharge instructions, follow-up care and prescription. VSS. Skin warm and dry. A&O x4.  

## 2018-02-06 ENCOUNTER — Ambulatory Visit (INDEPENDENT_AMBULATORY_CARE_PROVIDER_SITE_OTHER): Payer: BLUE CROSS/BLUE SHIELD | Admitting: Family Medicine

## 2018-02-06 ENCOUNTER — Encounter: Payer: Self-pay | Admitting: Family Medicine

## 2018-02-06 VITALS — BP 130/90 | HR 76 | Ht 62.0 in | Wt 170.0 lb

## 2018-02-06 DIAGNOSIS — Z01419 Encounter for gynecological examination (general) (routine) without abnormal findings: Secondary | ICD-10-CM

## 2018-02-06 DIAGNOSIS — L2082 Flexural eczema: Secondary | ICD-10-CM

## 2018-02-06 DIAGNOSIS — Z0000 Encounter for general adult medical examination without abnormal findings: Secondary | ICD-10-CM

## 2018-02-06 DIAGNOSIS — Z1272 Encounter for screening for malignant neoplasm of vagina: Secondary | ICD-10-CM

## 2018-02-06 DIAGNOSIS — Z1239 Encounter for other screening for malignant neoplasm of breast: Secondary | ICD-10-CM

## 2018-02-06 DIAGNOSIS — Z1231 Encounter for screening mammogram for malignant neoplasm of breast: Secondary | ICD-10-CM | POA: Diagnosis not present

## 2018-02-06 DIAGNOSIS — Z1211 Encounter for screening for malignant neoplasm of colon: Secondary | ICD-10-CM

## 2018-02-06 LAB — HEMOCCULT GUIAC POC 1CARD (OFFICE): Fecal Occult Blood, POC: NEGATIVE

## 2018-02-06 MED ORDER — TRIAMCINOLONE ACETONIDE 0.1 % EX CREA: 1.0000 "application " | TOPICAL_CREAM | Freq: Two times a day (BID) | CUTANEOUS | 2 refills | Status: DC

## 2018-02-06 NOTE — Progress Notes (Signed)
Name: Joan Stein   MRN: [id number]    DOB: 03/05/1966   Date:02/06/2018       Progress Note  Subjective  Chief Complaint  Chief Complaint  Patient presents with  . Annual Exam    pap, mammo, and colonoscopy  . Rash    patches of itchy, dry skin    Rash  This is a new problem. The current episode started in the past 7 days. The problem has been waxing and waning since onset. The rash is diffuse. The rash is characterized by dryness, itchiness, redness and scaling. She was exposed to nothing. Pertinent negatives include no congestion, cough, diarrhea, fatigue, fever, joint pain, nail changes, shortness of breath or sore throat. Past treatments include nothing. The treatment provided moderate relief. Her past medical history is significant for eczema. There is no history of asthma.    No problem-specific Assessment & Plan notes found for this encounter.   Past Medical History:  Diagnosis Date  . Allergy   . Chronic back pain   . Depression   . GERD (gastroesophageal reflux disease)   . Hyperlipidemia   . Hypertension   . Insomnia   . Reflux esophagitis   . Spondylosis     Past Surgical History:  Procedure Laterality Date  . ABDOMINAL HYSTERECTOMY    . BUNIONECTOMY    . TUBAL LIGATION      Family History  Problem Relation Age of Onset  . Diabetes Father   . Hypertension Father   . Diabetes Paternal Grandmother   . Arthritis Mother     Social History   Socioeconomic History  . Marital status: Divorced    Spouse name: Not on file  . Number of children: Not on file  . Years of education: Not on file  . Highest education level: Not on file  Occupational History  . Not on file  Social Needs  . Financial resource strain: Not on file  . Food insecurity:    Worry: Not on file    Inability: Not on file  . Transportation needs:    Medical: Not on file    Non-medical: Not on file  Tobacco Use  . Smoking status: Former Smoker    Types: Cigarettes  . Smokeless  tobacco: Never Used  Substance and Sexual Activity  . Alcohol use: No  . Drug use: No  . Sexual activity: Yes  Lifestyle  . Physical activity:    Days per week: Not on file    Minutes per session: Not on file  . Stress: Not on file  Relationships  . Social connections:    Talks on phone: Not on file    Gets together: Not on file    Attends religious service: Not on file    Active member of club or organization: Not on file    Attends meetings of clubs or organizations: Not on file    Relationship status: Not on file  . Intimate partner violence:    Fear of current or ex partner: Not on file    Emotionally abused: Not on file    Physically abused: Not on file    Forced sexual activity: Not on file  Other Topics Concern  . Not on file  Social History Narrative  . Not on file    No Known Allergies  Outpatient Medications Prior to Visit  Medication Sig Dispense Refill  . buPROPion (WELLBUTRIN SR) 150 MG 12 hr tablet Take 1 tablet (150 mg total) by mouth 2 (  two) times daily. 60 tablet 1  . cetirizine (ZYRTEC) 10 MG tablet Take 10 mg by mouth daily.    . fenofibrate 160 MG tablet Take 1 tablet (160 mg total) by mouth daily. 30 tablet 1  . fluticasone (FLONASE) 50 MCG/ACT nasal spray Place 1 spray into both nostrils daily as needed.     . gabapentin (NEURONTIN) 300 MG capsule Take 1 capsule (300 mg total) by mouth 3 (three) times daily. 90 capsule 1  . ibuprofen (ADVIL,MOTRIN) 600 MG tablet Take 600 mg by mouth 3 (three) times daily.    Marland Kitchen losartan (COZAAR) 50 MG tablet Take 1 tablet (50 mg total) by mouth daily. 30 tablet 1  . metaxalone (SKELAXIN) 800 MG tablet Take 1 tablet (800 mg total) by mouth at bedtime as needed for muscle spasms. 30 tablet 0  . montelukast (SINGULAIR) 10 MG tablet Take 10 mg by mouth at bedtime.    Marland Kitchen omeprazole (PRILOSEC) 40 MG capsule Take 1 capsule (40 mg total) by mouth daily. 30 capsule 1  . cyclobenzaprine (FLEXERIL) 10 MG tablet Take 1 tablet (10 mg  total) by mouth 2 (two) times daily. (Patient not taking: Reported on 01/10/2018) 30 tablet 6   No facility-administered medications prior to visit.     Review of Systems  Constitutional: Negative for chills, fatigue, fever, malaise/fatigue and weight loss.  HENT: Negative for congestion, ear discharge, ear pain and sore throat.   Eyes: Negative for blurred vision.  Respiratory: Negative for cough, sputum production, shortness of breath and wheezing.   Cardiovascular: Negative for chest pain, palpitations and leg swelling.  Gastrointestinal: Negative for abdominal pain, blood in stool, constipation, diarrhea, heartburn, melena and nausea.  Genitourinary: Negative for dysuria, frequency, hematuria and urgency.  Musculoskeletal: Negative for back pain, joint pain, myalgias and neck pain.  Skin: Positive for rash. Negative for nail changes.  Neurological: Negative for dizziness, tingling, sensory change, focal weakness and headaches.  Endo/Heme/Allergies: Negative for environmental allergies and polydipsia. Does not bruise/bleed easily.  Psychiatric/Behavioral: Negative for depression and suicidal ideas. The patient is not nervous/anxious and does not have insomnia.      Objective  Vitals:   02/06/18 0944  BP: 130/90  Pulse: 76  Weight: 170 lb (77.1 kg)  Height: 5\' 2"  (1.575 m)    Physical Exam  Constitutional: Vital signs are normal. She appears well-developed and well-nourished. No distress.  HENT:  Head: Normocephalic and atraumatic.  Right Ear: Hearing, tympanic membrane, external ear and ear canal normal.  Left Ear: Hearing, tympanic membrane, external ear and ear canal normal.  Nose: Nose normal. No mucosal edema, rhinorrhea or nasal deformity.  Mouth/Throat: Uvula is midline and oropharynx is clear and moist. No oropharyngeal exudate, posterior oropharyngeal edema or posterior oropharyngeal erythema.  Eyes: Pupils are equal, round, and reactive to light. Conjunctivae, EOM  and lids are normal. Right eye exhibits no discharge. Left eye exhibits no discharge.  Fundoscopic exam:      The right eye shows no arteriolar narrowing, no AV nicking and no papilledema.       The left eye shows no arteriolar narrowing, no AV nicking and no papilledema.  Neck: Trachea normal and normal range of motion. Neck supple. Normal carotid pulses, no hepatojugular reflux and no JVD present. Carotid bruit is not present. No neck rigidity. No edema present. No thyroid mass and no thyromegaly present.  Cardiovascular: Normal rate, regular rhythm, S1 normal, S2 normal, normal heart sounds, intact distal pulses and normal pulses. PMI is  not displaced. Exam reveals no gallop, no S3, no S4 and no friction rub.  No murmur heard. Pulses:      Carotid pulses are 2+ on the right side, and 2+ on the left side.      Radial pulses are 2+ on the right side, and 2+ on the left side.       Femoral pulses are 2+ on the right side, and 2+ on the left side.      Popliteal pulses are 2+ on the right side, and 2+ on the left side.       Dorsalis pedis pulses are 2+ on the right side, and 2+ on the left side.       Posterior tibial pulses are 2+ on the right side, and 2+ on the left side.  Pulmonary/Chest: Effort normal and breath sounds normal. No stridor. No respiratory distress. She has no decreased breath sounds. She has no wheezes. She has no rhonchi. She has no rales. Right breast exhibits no inverted nipple, no mass, no nipple discharge, no skin change and no tenderness. Left breast exhibits no inverted nipple, no mass, no nipple discharge, no skin change and no tenderness. No breast swelling, tenderness, discharge or bleeding. Breasts are symmetrical.  Abdominal: Soft. Normal appearance and bowel sounds are normal. She exhibits no mass. There is no hepatosplenomegaly. There is no tenderness. There is no rigidity, no guarding and no CVA tenderness.  Genitourinary: Rectum normal and vagina normal. Rectal  exam shows no mass and guaiac negative stool. Pelvic exam was performed with patient supine. There is no tenderness or lesion on the right labia. There is no tenderness or lesion on the left labia. Right adnexum displays no mass, no tenderness and no fullness. Left adnexum displays no mass, no tenderness and no fullness.  Musculoskeletal: Normal range of motion. She exhibits no edema.       Cervical back: Normal.       Thoracic back: Normal.       Lumbar back: Normal.  Lymphadenopathy:       Head (right side): No submental and no submandibular adenopathy present.       Head (left side): No submental and no submandibular adenopathy present.    She has no cervical adenopathy.  Neurological: She is alert. She has normal strength and normal reflexes. No cranial nerve deficit or sensory deficit.  Reflex Scores:      Tricep reflexes are 2+ on the right side and 2+ on the left side.      Bicep reflexes are 2+ on the right side and 2+ on the left side.      Brachioradialis reflexes are 2+ on the right side and 2+ on the left side.      Patellar reflexes are 2+ on the right side and 2+ on the left side.      Achilles reflexes are 2+ on the right side and 2+ on the left side. Skin: Skin is warm and dry. Rash noted. Rash is macular. She is not diaphoretic.     Psychiatric: She has a normal mood and affect.      Assessment & Plan  Problem List Items Addressed This Visit    None    Visit Diagnoses    Annual physical exam    -  Primary   Relevant Orders   MM Digital Screening   Ambulatory referral to Gastroenterology   POCT occult blood stool   Pap IG (Image Guided)   Visit for gynecologic  examination       Flexural eczema       Relevant Medications   triamcinolone cream (KENALOG) 0.1 %   Colon cancer screening       Relevant Orders   Ambulatory referral to Gastroenterology   POCT occult blood stool   Breast cancer screening       Relevant Orders   MM Digital Screening   Screening  for vaginal cancer       Relevant Orders   Pap IG (Image Guided)      Meds ordered this encounter  Medications  . triamcinolone cream (KENALOG) 0.1 %    Sig: Apply 1 application topically 2 (two) times daily.    Dispense:  30 g    Refill:  2   Pennelope Basque is a 52 y.o. female who presents today for her Complete Annual Exam. She feels well. She reports exercising . She reports she is sleeping well.  Immunizations are reviewed and recommendations provided.   Age appropriate screening tests are discussed. Counseling given for risk factor reduction interventions. Health risks of being over weight were discussed and patient was counseled on weight loss options and exercise. Dr. [facility]   Pt has been counseled on GI referral process. She is expecting a call from nurse to triage her. She also is going to contact her ins. Company to find out about pymnt on [facility]. 02/06/18

## 2018-02-07 LAB — PAP IG (IMAGE GUIDED): PAP Smear Comment: 0

## 2018-02-14 ENCOUNTER — Ambulatory Visit
Admission: RE | Admit: 2018-02-14 | Discharge: 2018-02-14 | Disposition: A | Discharge status: Home / Self Care | Payer: BC Managed Care – PPO | Source: Ambulatory Visit | Attending: Family Medicine | Admitting: Family Medicine

## 2018-02-14 DIAGNOSIS — Z0000 Encounter for general adult medical examination without abnormal findings: Secondary | ICD-10-CM

## 2018-02-14 DIAGNOSIS — Z1239 Encounter for other screening for malignant neoplasm of breast: Secondary | ICD-10-CM

## 2018-02-14 DIAGNOSIS — Z1231 Encounter for screening mammogram for malignant neoplasm of breast: Secondary | ICD-10-CM | POA: Diagnosis present

## 2018-03-06 ENCOUNTER — Inpatient Hospital Stay
Admission: RE | Admit: 2018-03-06 | Discharge: 2018-03-06 | Disposition: A | Discharge status: Home / Self Care | Payer: Self-pay | Source: Ambulatory Visit | Attending: *Deleted | Admitting: *Deleted

## 2018-03-06 ENCOUNTER — Other Ambulatory Visit: Payer: Self-pay | Admitting: *Deleted

## 2018-03-06 DIAGNOSIS — Z9289 Personal history of other medical treatment: Secondary | ICD-10-CM

## 2018-03-08 ENCOUNTER — Encounter: Payer: Self-pay | Admitting: *Deleted

## 2018-06-14 ENCOUNTER — Other Ambulatory Visit: Payer: Self-pay

## 2018-06-14 ENCOUNTER — Emergency Department
Admission: EM | Admit: 2018-06-14 | Discharge: 2018-06-14 | Disposition: A | Discharge status: Home / Self Care | Payer: BC Managed Care – PPO | Attending: Emergency Medicine | Admitting: Emergency Medicine

## 2018-06-14 ENCOUNTER — Emergency Department: Payer: BC Managed Care – PPO

## 2018-06-14 ENCOUNTER — Encounter: Payer: Self-pay | Admitting: *Deleted

## 2018-06-14 DIAGNOSIS — R079 Chest pain, unspecified: Secondary | ICD-10-CM | POA: Diagnosis not present

## 2018-06-14 DIAGNOSIS — Z79899 Other long term (current) drug therapy: Secondary | ICD-10-CM | POA: Diagnosis not present

## 2018-06-14 DIAGNOSIS — Z87891 Personal history of nicotine dependence: Secondary | ICD-10-CM | POA: Insufficient documentation

## 2018-06-14 DIAGNOSIS — I10 Essential (primary) hypertension: Secondary | ICD-10-CM | POA: Diagnosis not present

## 2018-06-14 LAB — CBC
HCT: 36.9 % (ref 35.0–47.0)
HEMOGLOBIN: 12.3 g/dL (ref 12.0–16.0)
MCH: 28.7 pg (ref 26.0–34.0)
MCHC: 33.3 g/dL (ref 32.0–36.0)
MCV: 86.1 fL (ref 80.0–100.0)
Platelets: 375 10*3/uL (ref 150–440)
RBC: 4.28 MIL/uL (ref 3.80–5.20)
RDW: 12.9 % (ref 11.5–14.5)
WBC: 9.1 10*3/uL (ref 3.6–11.0)

## 2018-06-14 LAB — BASIC METABOLIC PANEL
ANION GAP: 5 (ref 5–15)
BUN: 19 mg/dL (ref 6–20)
CO2: 28 mmol/L (ref 22–32)
CREATININE: 0.87 mg/dL (ref 0.44–1.00)
Calcium: 9.5 mg/dL (ref 8.9–10.3)
Chloride: 106 mmol/L (ref 98–111)
GFR calc non Af Amer: >60 mL/min (ref >60)
Glucose, Bld: 97 mg/dL (ref 70–99)
POTASSIUM: 3.9 mmol/L (ref 3.5–5.1)
SODIUM: 139 mmol/L (ref 135–145)

## 2018-06-14 LAB — FIBRIN DERIVATIVES D-DIMER (ARMC ONLY): Fibrin derivatives D-dimer (ARMC): 407.87 ng/mL (FEU) (ref 0.00–499.00)

## 2018-06-14 LAB — TROPONIN I: Troponin I: <0.03 ng/mL (ref <0.03)

## 2018-06-14 NOTE — ED Provider Notes (Signed)
[facility] Emergency Department Provider Note  Time seen: 5:48 PM  I have reviewed the triage vital signs and the nursing notes.   HISTORY  Chief Complaint Chest Pain    HPI Joan Stein is a 52 y.o. female with a past medical history of gastric reflux, depression, hypertension, hyperlipidemia, presents to the emergency department for chest pain.  According to the patient for the past 2 days she has been experiencing a dull chest discomfort in her left chest.  Denies any trouble breathing nausea or diaphoresis.  Denies any pleuritic pain.  States on occasion she will have swelling in her lower extremities especially her left lower extremity but this goes away with elevating her legs this has been ongoing for years.  Denies any leg pain.  No fever or cough but does state mild congestion.  Largely negative review of systems otherwise.   Past Medical History:  Diagnosis Date  . Allergy   . Chronic back pain   . Depression   . GERD (gastroesophageal reflux disease)   . Hyperlipidemia   . Hypertension   . Insomnia   . Reflux esophagitis   . Spondylosis     Patient Active Problem List   Diagnosis Date Noted  . DDD (degenerative disc disease), lumbar 10/05/2017  . Lumbar facet arthropathy (L4-5) (Bilateral) 10/05/2017  . Lumbar facet osteoarthritis 10/05/2017  . Osteoarthritis of lumbar spine 10/05/2017  . Chronic low back pain (Primary Area of Pain) (Bilateral) (L>R) 10/04/2017  . Chronic hip pain (Secondary Area of Pain) (Bilateral) (L>R) 10/04/2017  . Chronic sacroiliac joint pain (Bilateral) (L>R) 10/04/2017  . Lumbar facet syndrome (Bilateral) (L>R) 10/04/2017  . Chronic thoracic spine pain (Midline) 10/04/2017  . Chronic upper back (Midline) 10/04/2017  . Long term prescription opiate use 10/04/2017  . Opioid-induced constipation (OIC) 10/04/2017  . Neurogenic pain 10/04/2017  . Chronic musculoskeletal pain 10/04/2017  . Chronic pain syndrome  10/03/2017  . Disorder of skeletal system 10/03/2017  . Pharmacologic therapy 10/03/2017  . Problems influencing health status 10/03/2017  . NSAID long-term use 10/03/2017  . Opiate use 10/03/2017    Past Surgical History:  Procedure Laterality Date  . ABDOMINAL HYSTERECTOMY    . BUNIONECTOMY    . TUBAL LIGATION      Prior to Admission medications   Medication Sig Start Date End Date Taking? Authorizing Provider  buPROPion (WELLBUTRIN SR) 150 MG 12 hr tablet Take 1 tablet (150 mg total) by mouth 2 (two) times daily. 01/10/18   Duanne LimerickJones, Deanna C, MD  cetirizine (ZYRTEC) 10 MG tablet Take 10 mg by mouth daily.    [provider]  cyclobenzaprine (FLEXERIL) 10 MG tablet Take 1 tablet (10 mg total) by mouth 2 (two) times daily. Patient not taking: Reported on 01/10/2018 08/11/17   Duanne LimerickJones, Deanna C, MD  fenofibrate 160 MG tablet Take 1 tablet (160 mg total) by mouth daily. 01/10/18   Duanne LimerickJones, Deanna C, MD  fluticasone (FLONASE) 50 MCG/ACT nasal spray Place 1 spray into both nostrils daily as needed.     [provider]  gabapentin (NEURONTIN) 300 MG capsule Take 1 capsule (300 mg total) by mouth 3 (three) times daily. 01/10/18   Duanne LimerickJones, Deanna C, MD  ibuprofen (ADVIL,MOTRIN) 600 MG tablet Take 600 mg by mouth 3 (three) times daily.    [provider]  losartan (COZAAR) 50 MG tablet Take 1 tablet (50 mg total) by mouth daily. 01/10/18   Duanne LimerickJones, Deanna C, MD  metaxalone (SKELAXIN) 800 MG  tablet Take 1 tablet (800 mg total) by mouth at bedtime as needed for muscle spasms. 01/20/18   Cuthriell, Delorise Royals, PA-C  montelukast (SINGULAIR) 10 MG tablet Take 10 mg by mouth at bedtime.    [provider]  omeprazole (PRILOSEC) 40 MG capsule Take 1 capsule (40 mg total) by mouth daily. 01/10/18   Duanne Limerick, MD  triamcinolone cream (KENALOG) 0.1 % Apply 1 application topically 2 (two) times daily. 02/06/18   Duanne Limerick, MD    No Known Allergies  Family History   Problem Relation Age of Onset  . Diabetes Father   . Hypertension Father   . Diabetes Paternal Grandmother   . Arthritis Mother   . Breast cancer Maternal Grandmother 65    Social History Social History   Tobacco Use  . Smoking status: Former Smoker    Types: Cigarettes  . Smokeless tobacco: Never Used  Substance Use Topics  . Alcohol use: No  . Drug use: No    Review of Systems Constitutional: Negative for fever. ENT: Mild congestion Cardiovascular: Dull left-sided chest pain x2 days. Respiratory: Negative for shortness of breath. Gastrointestinal: Negative for abdominal pain Genitourinary: Negative for urinary compaints Musculoskeletal: Negative for musculoskeletal complaints Skin: Negative for skin complaints  Neurological: Negative for headache All other ROS negative  ____________________________________________   PHYSICAL EXAM:  VITAL SIGNS: ED Triage Vitals  Enc Vitals Group     BP 06/14/18 1655 (!) 156/98     Pulse Rate 06/14/18 1655 99     Resp 06/14/18 1655 20     Temp 06/14/18 1655 98.4 F (36.9 C)     Temp Source 06/14/18 1655 Oral     SpO2 06/14/18 1655 100 %     Weight 06/14/18 1654 170 lb (77.1 kg)     Height 06/14/18 1654 5\' 2"  (1.575 m)     Head Circumference --      Peak Flow --      Pain Score 06/14/18 1654 5     Pain Loc --      Pain Edu? --      Excl. in GC? --    Constitutional: Alert and oriented. Well appearing and in no distress. Eyes: Normal exam ENT   Head: Normocephalic and atraumatic.   Mouth/Throat: Mucous membranes are moist. Cardiovascular: Normal rate, regular rhythm. No murmur Respiratory: Normal respiratory effort without tachypnea nor retractions. Breath sounds are clear.  Chest wall nontender. Gastrointestinal: Soft and nontender. No distention.  Musculoskeletal: Nontender with normal range of motion in all extremities.  No lower extremity tenderness.  No edema. Neurologic:  Normal speech and language. No  gross focal neurologic deficits  Skin:  Skin is warm, dry and intact.  Psychiatric: Mood and affect are normal.   ____________________________________________    EKG  Normal sinus rhythm at 94 bpm with a narrow QRS, normal axis, largely normal intervals with no concerning but nonspecific ST changes. ____________________________________________    RADIOLOGY  Negative x-ray  ____________________________________________   INITIAL IMPRESSION / ASSESSMENT AND PLAN / ED COURSE  Pertinent labs & imaging results that were available during my care of the patient were reviewed by me and considered in my medical decision making (see chart for details).  Patient presents to the emergency department for left-sided chest pain over the past 2 days.  Overall the patient appears very well.  Nontender chest.  Labs are largely within normal limits including negative troponin.  EKG is reassuring, do not suspect ACS given  the negative troponin and well-appearing EKG.  However the patient does state occasionally she has leg swelling especially in her left lower extremity none currently and they are nontender as a precaution I have added on a d-dimer, patient agreeable to this plan of care.  Negative x-ray.  Labs are normal including negative troponin.  D-dimer has resulted negative.  Overall very reassuring exam and work-up.  Patient will be discharged home with PCP follow-up I discussed my normal chest pain return precautions.  ____________________________________________   FINAL CLINICAL IMPRESSION(S) / ED DIAGNOSES  Left chest pain    Minna Antis, MD 06/14/18 Windell Moment

## 2018-06-14 NOTE — ED Triage Notes (Signed)
Pt to triage via wheelchair.  Pt reports pain beneath left breast 2 days ago.  Pt now has chills, pain in center of chest today.  No sob.  Pt alert.  Speech clear.

## 2018-09-07 ENCOUNTER — Other Ambulatory Visit: Payer: Self-pay | Admitting: Family Medicine

## 2018-09-07 DIAGNOSIS — F32A Depression, unspecified: Secondary | ICD-10-CM

## 2018-09-07 DIAGNOSIS — E785 Hyperlipidemia, unspecified: Secondary | ICD-10-CM

## 2018-09-07 DIAGNOSIS — F329 Major depressive disorder, single episode, unspecified: Secondary | ICD-10-CM

## 2018-10-06 ENCOUNTER — Other Ambulatory Visit: Payer: Self-pay | Admitting: Family Medicine

## 2018-10-06 DIAGNOSIS — E785 Hyperlipidemia, unspecified: Secondary | ICD-10-CM

## 2018-10-06 DIAGNOSIS — F32A Depression, unspecified: Secondary | ICD-10-CM

## 2018-10-06 DIAGNOSIS — F329 Major depressive disorder, single episode, unspecified: Secondary | ICD-10-CM

## 2018-10-23 ENCOUNTER — Ambulatory Visit: Payer: BC Managed Care – PPO | Admitting: Family Medicine

## 2018-10-23 ENCOUNTER — Encounter: Payer: Self-pay | Admitting: Family Medicine

## 2018-10-23 VITALS — BP 142/100 | HR 100 | Temp 98.0°F | Ht 62.0 in | Wt 157.0 lb

## 2018-10-23 DIAGNOSIS — R509 Fever, unspecified: Secondary | ICD-10-CM | POA: Diagnosis not present

## 2018-10-23 DIAGNOSIS — R059 Cough, unspecified: Secondary | ICD-10-CM

## 2018-10-23 DIAGNOSIS — R05 Cough: Secondary | ICD-10-CM | POA: Diagnosis not present

## 2018-10-23 DIAGNOSIS — J0100 Acute maxillary sinusitis, unspecified: Secondary | ICD-10-CM

## 2018-10-23 LAB — POCT INFLUENZA A/B
Influenza A, POC: NEGATIVE
Influenza B, POC: NEGATIVE

## 2018-10-23 MED ORDER — GUAIFENESIN-CODEINE 100-10 MG/5ML PO SYRP: 5.0000 mL | ORAL_SOLUTION | Freq: Three times a day (TID) | ORAL | 0 refills | Status: DC | PRN

## 2018-10-23 MED ORDER — AMOXICILLIN 500 MG PO CAPS: 500.0000 mg | ORAL_CAPSULE | Freq: Three times a day (TID) | ORAL | 0 refills | Status: DC

## 2018-10-23 NOTE — Progress Notes (Signed)
Date:  10/23/2018   Name:  Joan Stein   DOB:  1966-04-07   MRN:  [id number]   Chief Complaint: Sinusitis (body aches, cough with a little foamy/ white production. Green production from nose. Fever and chills- taking mucinex. Started 2 days ago)  Sinusitis  This is a new problem. The current episode started in the past 7 days. The problem is unchanged. The maximum temperature recorded prior to her arrival was 101 - 101.9 F. The fever has been present for 1 to 2 days. The pain is mild. Associated symptoms include chills, congestion, coughing, diaphoresis, ear pain, headaches, a hoarse voice, neck pain, shortness of breath, sinus pressure, sneezing, a sore throat and swollen glands. Past treatments include oral decongestants. The treatment provided moderate relief.  Fever   This is a new problem. The problem occurs intermittently. The problem has been waxing and waning. Associated symptoms include congestion, coughing, ear pain, headaches, muscle aches, a sore throat and wheezing. Pertinent negatives include no abdominal pain, diarrhea, nausea, rash, urinary pain or vomiting. Associated symptoms comments: Nasal yellow green discharge. The treatment provided moderate relief.  Risk factors: sick contacts   Risk factors comment:  Father with influenza   Review of Systems  Constitutional: Positive for chills, diaphoresis and fever. Negative for fatigue and unexpected weight change.  HENT: Positive for congestion, ear pain, hoarse voice, sinus pressure, sneezing and sore throat. Negative for ear discharge and rhinorrhea.   Eyes: Negative for photophobia, pain, discharge, redness and itching.  Respiratory: Positive for cough, shortness of breath and wheezing. Negative for stridor.   Gastrointestinal: Negative for abdominal pain, blood in stool, constipation, diarrhea, nausea and vomiting.  Endocrine: Negative for cold intolerance, heat intolerance, polydipsia, polyphagia and polyuria.  Genitourinary:  Negative for dysuria, flank pain, frequency, hematuria, menstrual problem, pelvic pain, urgency, vaginal bleeding and vaginal discharge.  Musculoskeletal: Positive for neck pain. Negative for arthralgias, back pain and myalgias.  Skin: Negative for rash.  Allergic/Immunologic: Negative for environmental allergies and food allergies.  Neurological: Positive for headaches. Negative for dizziness, weakness, light-headedness and numbness.  Hematological: Negative for adenopathy. Does not bruise/bleed easily.  Psychiatric/Behavioral: Negative for dysphoric mood. The patient is not nervous/anxious.     Patient Active Problem List   Diagnosis Date Noted  . DDD (degenerative disc disease), lumbar 10/05/2017  . Lumbar facet arthropathy (L4-5) (Bilateral) 10/05/2017  . Lumbar facet osteoarthritis 10/05/2017  . Osteoarthritis of lumbar spine 10/05/2017  . Chronic low back pain (Primary Area of Pain) (Bilateral) (L>R) 10/04/2017  . Chronic hip pain (Secondary Area of Pain) (Bilateral) (L>R) 10/04/2017  . Chronic sacroiliac joint pain (Bilateral) (L>R) 10/04/2017  . Lumbar facet syndrome (Bilateral) (L>R) 10/04/2017  . Chronic thoracic spine pain (Midline) 10/04/2017  . Chronic upper back (Midline) 10/04/2017  . Long term prescription opiate use 10/04/2017  . Opioid-induced constipation (OIC) 10/04/2017  . Neurogenic pain 10/04/2017  . Chronic musculoskeletal pain 10/04/2017  . Chronic pain syndrome 10/03/2017  . Disorder of skeletal system 10/03/2017  . Pharmacologic therapy 10/03/2017  . Problems influencing health status 10/03/2017  . NSAID long-term use 10/03/2017  . Opiate use 10/03/2017    No Known Allergies  Past Surgical History:  Procedure Laterality Date  . ABDOMINAL HYSTERECTOMY    . BUNIONECTOMY    . TUBAL LIGATION      Social History   Tobacco Use  . Smoking status: Former Smoker    Types: Cigarettes  . Smokeless tobacco: Never Used  Substance Use Topics  .  Alcohol  use: No  . Drug use: No     Medication list has been reviewed and updated.  Current Meds  Medication Sig  . buPROPion (WELLBUTRIN SR) 150 MG 12 hr tablet TAKE 1 TABLET(150 MG) BY MOUTH TWICE DAILY  . cetirizine (ZYRTEC) 10 MG tablet Take 10 mg by mouth daily.  . fenofibrate 160 MG tablet TAKE 1 TABLET(160 MG) BY MOUTH DAILY  . fluticasone (FLONASE) 50 MCG/ACT nasal spray Place 1 spray into both nostrils daily as needed.   Marland Kitchen ibuprofen (ADVIL,MOTRIN) 600 MG tablet Take 600 mg by mouth 3 (three) times daily.  Marland Kitchen losartan (COZAAR) 50 MG tablet TAKE 1 TABLET BY MOUTH DAILY  . montelukast (SINGULAIR) 10 MG tablet Take 10 mg by mouth at bedtime.  Marland Kitchen omeprazole (PRILOSEC) 40 MG capsule Take 1 capsule (40 mg total) by mouth daily.    PHQ 2/9 Scores 10/04/2017  PHQ - 2 Score 0    Physical Exam Vitals signs and nursing note reviewed.  Constitutional:      General: She is not in acute distress.    Appearance: She is not diaphoretic.  HENT:     Head: Normocephalic and atraumatic.     Right Ear: Hearing, tympanic membrane, ear canal and external ear normal.     Left Ear: Hearing, tympanic membrane, ear canal and external ear normal.     Nose: Nose normal.     Right Turbinates: Swollen.     Left Turbinates: Swollen.     Mouth/Throat:     Mouth: Mucous membranes are moist.     Pharynx: No pharyngeal swelling, oropharyngeal exudate or posterior oropharyngeal erythema.     Tonsils: No tonsillar exudate.  Eyes:     General:        Right eye: No discharge.        Left eye: No discharge.     Conjunctiva/sclera: Conjunctivae normal.     Pupils: Pupils are equal, round, and reactive to light.  Neck:     Musculoskeletal: Normal range of motion and neck supple.     Thyroid: No thyromegaly.     Vascular: No JVD.  Cardiovascular:     Rate and Rhythm: Normal rate and regular rhythm.     Heart sounds: Normal heart sounds. No murmur. No friction rub. No gallop.   Pulmonary:     Effort: Pulmonary  effort is normal.     Breath sounds: Normal breath sounds.  Abdominal:     General: Bowel sounds are normal.     Palpations: Abdomen is soft. There is no mass.     Tenderness: There is no abdominal tenderness. There is no guarding.  Musculoskeletal: Normal range of motion.  Lymphadenopathy:     Cervical: No cervical adenopathy.  Skin:    General: Skin is warm and dry.  Neurological:     Mental Status: She is alert.     Deep Tendon Reflexes: Reflexes are normal and symmetric.     BP (!) 142/100   Pulse 100   Temp 98 F (36.7 C) (Oral)   Ht 5\' 2"  (1.575 m)   Wt 157 lb (71.2 kg)   BMI 28.72 kg/m   Assessment and Plan: 1. Fever, unspecified fever cause Tested for flu- negative A and B - POCT Influenza A/B  2. Acute maxillary sinusitis, recurrence not specified Acute. Currently doesn't have a fever. Treat with Amox. - amoxicillin (AMOXIL) 500 MG capsule; Take 1 capsule (500 mg total) by mouth 3 (three) times daily.  Dispense: 30 capsule; Refill: 0  3. Cough Acute. Cough- fill Rob AC - guaiFENesin-codeine (ROBITUSSIN AC) 100-10 MG/5ML syrup; Take 5 mLs by mouth 3 (three) times daily as needed for cough.  Dispense: 100 mL; Refill: 0

## 2019-02-07 ENCOUNTER — Encounter: Payer: BLUE CROSS/BLUE SHIELD | Admitting: Family Medicine

## 2019-03-20 ENCOUNTER — Other Ambulatory Visit: Payer: Self-pay

## 2019-03-20 ENCOUNTER — Encounter: Payer: Self-pay | Admitting: Family Medicine

## 2019-03-20 ENCOUNTER — Ambulatory Visit (INDEPENDENT_AMBULATORY_CARE_PROVIDER_SITE_OTHER): Payer: BC Managed Care – PPO | Admitting: Family Medicine

## 2019-03-20 DIAGNOSIS — K2900 Acute gastritis without bleeding: Secondary | ICD-10-CM

## 2019-03-20 MED ORDER — FAMOTIDINE 40 MG PO TABS: 40.0000 mg | ORAL_TABLET | Freq: Every day | ORAL | 2 refills | Status: DC

## 2019-03-20 NOTE — Progress Notes (Signed)
Date:  03/20/2019   Name:  Joan Stein   DOB:  03/04/1966   MRN:  [id number]   Chief Complaint: Emesis (nausea and hot to cold- last for about 1 week and goes away. comes back 2-3 weeks later. )  I connected withthis patient, Joan Stein, by telephoneat the patient's home.  I verified that I am speaking with the correct person using two identifiers. This visit was conducted via telephone due to the Covid-19 outbreak from [facility] in [place], [place]. I discussed the limitations, risks, security and privacy concerns of performing an evaluation and management service by telephone. I also discussed with the patient that there may be a patient responsible charge related to this service. The patient expressed understanding and agreed to proceed.  Emesis   This is a new problem. The current episode started more than 1 month ago (3 months). The problem occurs intermittently (every now and then). The problem has been waxing and waning. There has been no fever. Associated symptoms include coughing and sweats. Pertinent negatives include no abdominal pain, arthralgias, chest pain, chills, diarrhea, dizziness, fever, headaches, myalgias, URI or weight loss. Associated symptoms comments: Cold intolerance/ sweats for3 months. Treatments tried: use to take omeprazole.    Review of Systems  Constitutional: Negative for chills, fever and weight loss.  Respiratory: Positive for cough.   Cardiovascular: Negative for chest pain.  Gastrointestinal: Positive for vomiting. Negative for abdominal pain and diarrhea.  Musculoskeletal: Negative for arthralgias and myalgias.  Neurological: Negative for dizziness and headaches.    Patient Active Problem List   Diagnosis Date Noted  . DDD (degenerative disc disease), lumbar 10/05/2017  . Lumbar facet arthropathy (L4-5) (Bilateral) 10/05/2017  . Lumbar facet osteoarthritis 10/05/2017  . Osteoarthritis of lumbar spine 10/05/2017  . Chronic low  back pain (Primary Area of Pain) (Bilateral) (L>R) 10/04/2017  . Chronic hip pain (Secondary Area of Pain) (Bilateral) (L>R) 10/04/2017  . Chronic sacroiliac joint pain (Bilateral) (L>R) 10/04/2017  . Lumbar facet syndrome (Bilateral) (L>R) 10/04/2017  . Chronic thoracic spine pain (Midline) 10/04/2017  . Chronic upper back (Midline) 10/04/2017  . Long term prescription opiate use 10/04/2017  . Opioid-induced constipation (OIC) 10/04/2017  . Neurogenic pain 10/04/2017  . Chronic musculoskeletal pain 10/04/2017  . Chronic pain syndrome 10/03/2017  . Disorder of skeletal system 10/03/2017  . Pharmacologic therapy 10/03/2017  . Problems influencing health status 10/03/2017  . NSAID long-term use 10/03/2017  . Opiate use 10/03/2017    No Known Allergies  Past Surgical History:  Procedure Laterality Date  . ABDOMINAL HYSTERECTOMY    . BUNIONECTOMY    . TUBAL LIGATION      Social History   Tobacco Use  . Smoking status: Former Smoker    Types: Cigarettes  . Smokeless tobacco: Never Used  Substance Use Topics  . Alcohol use: No  . Drug use: No     Medication list has been reviewed and updated.  Current Meds  Medication Sig  . buPROPion (WELLBUTRIN SR) 150 MG 12 hr tablet TAKE 1 TABLET(150 MG) BY MOUTH TWICE DAILY  . cetirizine (ZYRTEC) 10 MG tablet Take 10 mg by mouth daily.  . fenofibrate 160 MG tablet TAKE 1 TABLET(160 MG) BY MOUTH DAILY  . fluticasone (FLONASE) 50 MCG/ACT nasal spray Place 1 spray into both nostrils daily as needed.   Marland Kitchen. losartan (COZAAR) 50 MG tablet TAKE 1 TABLET BY MOUTH DAILY  . montelukast (SINGULAIR) 10 MG tablet Take 10 mg  by mouth at bedtime.  Marland Kitchen omeprazole (PRILOSEC) 40 MG capsule Take 1 capsule (40 mg total) by mouth daily.    PHQ 2/9 Scores 03/20/2019 10/04/2017  PHQ - 2 Score 4 0  PHQ- 9 Score 9 -    BP Readings from Last 3 Encounters:  10/23/18 (!) 142/100  06/14/18 (!) 133/114  02/06/18 130/90    Physical Exam  Wt Readings from  Last 3 Encounters:  10/23/18 157 lb (71.2 kg)  06/14/18 170 lb (77.1 kg)  02/06/18 170 lb (77.1 kg)    There were no vitals taken for this visit. This is a limited physical exam because this is a tele-visit. Assessment and Plan: 1. Acute superficial gastritis without hemorrhage Patient picked up her last prescriptions in January which means that she unlikely had medication during the past 3 to 4 months.  Patient has had mornings that she awakens and she has sudden onset of nausea with emesis.  I rather doubt that she has been taking omeprazole and certainly have not been taken on a regular basis.  We will put her on Pepcid 40 mg and explained that we need to take this on a regular basis because of the possibility of a gastritis situation or early peptic ulcer disease.  And will return in 30 days for medication recheck. - famotidine (PEPCID) 40 MG tablet; Take 1 tablet (40 mg total) by mouth daily.  Dispense: 30 tablet; Refill: 2  I spent 10 minutes with this patient, More than 50% of that time was spent in face to face education, counseling and care coordination.

## 2019-04-09 ENCOUNTER — Other Ambulatory Visit: Payer: Self-pay

## 2019-04-09 ENCOUNTER — Encounter: Payer: Self-pay | Admitting: Family Medicine

## 2019-04-09 ENCOUNTER — Ambulatory Visit: Payer: BC Managed Care – PPO | Admitting: Family Medicine

## 2019-04-09 VITALS — BP 130/80 | HR 80 | Ht 62.0 in | Wt 155.0 lb

## 2019-04-09 DIAGNOSIS — E785 Hyperlipidemia, unspecified: Secondary | ICD-10-CM | POA: Diagnosis not present

## 2019-04-09 DIAGNOSIS — F329 Major depressive disorder, single episode, unspecified: Secondary | ICD-10-CM

## 2019-04-09 DIAGNOSIS — R110 Nausea: Secondary | ICD-10-CM

## 2019-04-09 DIAGNOSIS — Z1211 Encounter for screening for malignant neoplasm of colon: Secondary | ICD-10-CM

## 2019-04-09 DIAGNOSIS — K219 Gastro-esophageal reflux disease without esophagitis: Secondary | ICD-10-CM | POA: Diagnosis not present

## 2019-04-09 DIAGNOSIS — I10 Essential (primary) hypertension: Secondary | ICD-10-CM

## 2019-04-09 DIAGNOSIS — F32A Depression, unspecified: Secondary | ICD-10-CM

## 2019-04-09 DIAGNOSIS — M21611 Bunion of right foot: Secondary | ICD-10-CM

## 2019-04-09 MED ORDER — BUPROPION HCL ER (SR) 150 MG PO TB12: ORAL_TABLET | ORAL | 5 refills | Status: DC

## 2019-04-09 MED ORDER — FENOFIBRATE 160 MG PO TABS: ORAL_TABLET | ORAL | 5 refills | Status: DC

## 2019-04-09 MED ORDER — PANTOPRAZOLE SODIUM 40 MG PO TBEC: 40.0000 mg | DELAYED_RELEASE_TABLET | Freq: Every day | ORAL | 5 refills | Status: DC

## 2019-04-09 MED ORDER — LOSARTAN POTASSIUM 50 MG PO TABS: 50.0000 mg | ORAL_TABLET | Freq: Every day | ORAL | 5 refills | Status: DC

## 2019-04-09 NOTE — Progress Notes (Signed)
Date:  04/09/2019   Name:  Joan Stein   DOB:  10/26/1965   MRN:  [id number]   Chief Complaint: Gastroesophageal Reflux, Hyperlipidemia, Hypertension, and Depression (PHQ9=8)  Gastroesophageal Reflux She complains of nausea. She reports no abdominal pain, no belching, no chest pain, no choking, no coughing, no dysphagia, no early satiety, no globus sensation, no heartburn, no hoarse voice, no sore throat, no stridor, no tooth decay, no water brash or no wheezing. This is a chronic problem. The current episode started more than 1 year ago. The problem occurs frequently. The problem has been gradually improving. The symptoms are aggravated by certain foods. Associated symptoms include fatigue. Pertinent negatives include no anemia, melena, muscle weakness, orthopnea or weight loss. There are no known risk factors. She has tried a PPI for the symptoms. The treatment provided mild relief. Past procedures do not include an abdominal ultrasound, an EGD, esophageal manometry, esophageal pH monitoring, H. pylori antibody titer or a UGI.  Hyperlipidemia This is a chronic problem. The current episode started more than 1 year ago. The problem is controlled. Recent lipid tests were reviewed and are normal. She has no history of chronic renal disease. Pertinent negatives include no chest pain, focal sensory loss, focal weakness, leg pain, myalgias or shortness of breath. Current antihyperlipidemic treatment includes statins. There are no compliance problems.   Hypertension This is a chronic problem. The current episode started more than 1 year ago. The problem has been gradually improving since onset. The problem is controlled. Pertinent negatives include no anxiety, blurred vision, chest pain, headaches, malaise/fatigue, neck pain, orthopnea, palpitations, peripheral edema, PND, shortness of breath or sweats. There are no associated agents to hypertension. There are no known risk factors for coronary artery  disease. Past treatments include angiotensin blockers. The current treatment provides moderate improvement. There are no compliance problems.  There is no history of angina, kidney disease, CAD/MI, CVA, heart failure, left ventricular hypertrophy, PVD or retinopathy. There is no history of chronic renal disease, a hypertension causing med or renovascular disease.  Depression        Chronicity: off wellbutrin for a period of time.  The onset quality is gradual.   Associated symptoms include fatigue, helplessness, hopelessness, insomnia, decreased interest and sad.  Associated symptoms include no decreased concentration, not irritable, no restlessness, no appetite change, no body aches, no myalgias, no headaches, no indigestion and no suicidal ideas.   Pertinent negatives include no anxiety.   Review of Systems  Constitutional: Positive for fatigue. Negative for appetite change, chills, fever, malaise/fatigue, unexpected weight change and weight loss.  HENT: Negative for congestion, ear discharge, ear pain, hoarse voice, rhinorrhea, sinus pressure, sneezing and sore throat.   Eyes: Negative for blurred vision, photophobia, pain, discharge, redness and itching.  Respiratory: Negative for cough, choking, shortness of breath, wheezing and stridor.   Cardiovascular: Negative for chest pain, palpitations, orthopnea and PND.  Gastrointestinal: Positive for nausea. Negative for abdominal pain, blood in stool, constipation, diarrhea, dysphagia, heartburn, melena and vomiting.  Endocrine: Negative for cold intolerance, heat intolerance, polydipsia, polyphagia and polyuria.  Genitourinary: Negative for dysuria, flank pain, frequency, hematuria, menstrual problem, pelvic pain, urgency, vaginal bleeding and vaginal discharge.  Musculoskeletal: Negative for arthralgias, back pain, myalgias, muscle weakness and neck pain.  Skin: Negative for rash.  Allergic/Immunologic: Negative for environmental allergies and food  allergies.  Neurological: Negative for dizziness, focal weakness, weakness, light-headedness, numbness and headaches.  Hematological: Negative for adenopathy. Does not bruise/bleed easily.  Psychiatric/Behavioral:  Positive for depression. Negative for decreased concentration, dysphoric mood and suicidal ideas. The patient has insomnia. The patient is not nervous/anxious.     Patient Active Problem List   Diagnosis Date Noted  . DDD (degenerative disc disease), lumbar 10/05/2017  . Lumbar facet arthropathy (L4-5) (Bilateral) 10/05/2017  . Lumbar facet osteoarthritis 10/05/2017  . Osteoarthritis of lumbar spine 10/05/2017  . Chronic low back pain (Primary Area of Pain) (Bilateral) (L>R) 10/04/2017  . Chronic hip pain (Secondary Area of Pain) (Bilateral) (L>R) 10/04/2017  . Chronic sacroiliac joint pain (Bilateral) (L>R) 10/04/2017  . Lumbar facet syndrome (Bilateral) (L>R) 10/04/2017  . Chronic thoracic spine pain (Midline) 10/04/2017  . Chronic upper back (Midline) 10/04/2017  . Long term prescription opiate use 10/04/2017  . Opioid-induced constipation (OIC) 10/04/2017  . Neurogenic pain 10/04/2017  . Chronic musculoskeletal pain 10/04/2017  . Chronic pain syndrome 10/03/2017  . Disorder of skeletal system 10/03/2017  . Pharmacologic therapy 10/03/2017  . Problems influencing health status 10/03/2017  . NSAID long-term use 10/03/2017  . Opiate use 10/03/2017    No Known Allergies  Past Surgical History:  Procedure Laterality Date  . ABDOMINAL HYSTERECTOMY    . BUNIONECTOMY    . TUBAL LIGATION      Social History   Tobacco Use  . Smoking status: Former Smoker    Types: Cigarettes  . Smokeless tobacco: Never Used  Substance Use Topics  . Alcohol use: No  . Drug use: No     Medication list has been reviewed and updated.  Current Meds  Medication Sig  . buPROPion (WELLBUTRIN SR) 150 MG 12 hr tablet TAKE 1 TABLET(150 MG) BY MOUTH TWICE DAILY  . cetirizine (ZYRTEC)  10 MG tablet Take 10 mg by mouth daily.  . fenofibrate 160 MG tablet TAKE 1 TABLET(160 MG) BY MOUTH DAILY  . fluticasone (FLONASE) 50 MCG/ACT nasal spray Place 1 spray into both nostrils daily as needed.   Marland Kitchen. ibuprofen (ADVIL,MOTRIN) 600 MG tablet Take 600 mg by mouth 3 (three) times daily.  Marland Kitchen. losartan (COZAAR) 50 MG tablet TAKE 1 TABLET BY MOUTH DAILY  . montelukast (SINGULAIR) 10 MG tablet Take 10 mg by mouth at bedtime.  . [DISCONTINUED] omeprazole (PRILOSEC) 40 MG capsule Take 1 capsule (40 mg total) by mouth daily.    PHQ 2/9 Scores 04/09/2019 03/20/2019 10/04/2017  PHQ - 2 Score 1 4 0  PHQ- 9 Score 8 9 -    BP Readings from Last 3 Encounters:  04/09/19 130/80  10/23/18 (!) 142/100  06/14/18 (!) 133/114    Physical Exam Vitals signs and nursing note reviewed.  Constitutional:      General: She is not irritable.She is not in acute distress.    Appearance: She is well-developed. She is not diaphoretic.  HENT:     Head: Normocephalic and atraumatic.     Jaw: There is normal jaw occlusion.     Right Ear: Tympanic membrane, ear canal and external ear normal.     Left Ear: Tympanic membrane, ear canal and external ear normal.     Nose: Nose normal.  Eyes:     General: Lids are everted, no foreign bodies appreciated. No scleral icterus.       Right eye: No discharge.        Left eye: No foreign body, discharge or hordeolum.     Conjunctiva/sclera: Conjunctivae normal.     Right eye: Right conjunctiva is not injected.     Left eye: Left conjunctiva is not  injected.     Pupils: Pupils are equal, round, and reactive to light.  Neck:     Musculoskeletal: Normal range of motion and neck supple. No neck rigidity or muscular tenderness.     Thyroid: No thyroid mass or thyromegaly.     Vascular: Normal carotid pulses. No carotid bruit, hepatojugular reflux or JVD.     Trachea: Trachea and phonation normal. No tracheal tenderness, abnormal tracheal secretions or tracheal deviation.   Cardiovascular:     Rate and Rhythm: Normal rate and regular rhythm.     Chest Wall: PMI is not displaced. No thrill.     Pulses: Normal pulses. No decreased pulses.          Dorsalis pedis pulses are 2+ on the right side and 2+ on the left side.     Heart sounds: Normal heart sounds, S1 normal and S2 normal. No murmur. No systolic murmur. No diastolic murmur. No friction rub. No gallop. No S3 or S4 sounds.   Pulmonary:     Effort: Pulmonary effort is normal. No respiratory distress.     Breath sounds: Normal breath sounds. No wheezing or rales.  Abdominal:     General: Bowel sounds are normal.     Palpations: Abdomen is soft. There is no mass.     Tenderness: There is no abdominal tenderness. There is no guarding or rebound.  Musculoskeletal: Normal range of motion.        General: No tenderness.     Right wrist: She exhibits swelling.     Cervical back: She exhibits no tenderness, no bony tenderness and no spasm.     Right lower leg: No edema.     Left lower leg: No edema.     Right foot: Deformity and bunion present.     Left foot: No bunion.     Comments: Bunion/ganglion cyst   Feet:     Right foot:     Skin integrity: Skin integrity normal.     Left foot:     Skin integrity: Skin integrity normal.  Lymphadenopathy:     Cervical: No cervical adenopathy.     Right cervical: No superficial, deep or posterior cervical adenopathy.    Left cervical: No superficial, deep or posterior cervical adenopathy.  Skin:    General: Skin is warm and dry.     Findings: No rash.  Neurological:     Mental Status: She is alert and oriented to person, place, and time.     Cranial Nerves: No cranial nerve deficit.     Deep Tendon Reflexes: Reflexes are normal and symmetric. Reflexes normal.  Psychiatric:        Mood and Affect: Mood is not anxious or depressed.     Wt Readings from Last 3 Encounters:  04/09/19 155 lb (70.3 kg)  10/23/18 157 lb (71.2 kg)  06/14/18 170 lb (77.1 kg)     BP 130/80   Pulse 80   Ht 5\' 2"  (1.575 m)   Wt 155 lb (70.3 kg)   BMI 28.35 kg/m   Assessment and Plan:   1. Depression, unspecified depression type Chronic.  Controlled.  Continue bupropion 150 mg 1 tablet twice a day.  PHQ score was 8 recheck in 6 months. - buPROPion (WELLBUTRIN SR) 150 MG 12 hr tablet; TAKE 1 TABLET(150 MG) BY MOUTH TWICE DAILY  Dispense: 60 tablet; Refill: 5  2. Hyperlipidemia, unspecified hyperlipidemia type Chronic.  Controlled.  Continue fenofibrate 160 mg once a day.  Will  check lipid panel. - fenofibrate 160 MG tablet; TAKE 1 TABLET(160 MG) BY MOUTH DAILY  Dispense: 30 tablet; Refill: 5 - Lipid panel  3. Essential hypertension Chronic.  Controlled.  Continue losartan 50 mg once a day.  Will check renal function panel. - losartan (COZAAR) 50 MG tablet; Take 1 tablet (50 mg total) by mouth daily.  Dispense: 30 tablet; Refill: 5 - Renal Function Panel  4. Gastroesophageal reflux disease, esophagitis presence not specified Patient is unable to continue Pepcid because of unavailability.  Patient is also been having some nausea that we will discuss below.  Therefore we will up to a PPI and we will start her on pantoprazole 40 mg once a day.  Recheck in 6 months - pantoprazole (PROTONIX) 40 MG tablet; Take 1 tablet (40 mg total) by mouth daily.  Dispense: 30 tablet; Refill: 5  5. Colon cancer screening Discussion of screening and referral submitted. - Ambulatory referral to Gastroenterology  6. Bunion of great toe of right foot Patient has surgery done on the left foot for a bunion and would like because of increasing pain and callus formation to be scheduled for surgery of the right foot.  Referral to podiatry. - Ambulatory referral to Podiatry  7. Nausea Patient's been having increased nausea particularly at night while on antihistamine to medications.  Will refer to gastroenterology for colonoscopy and discussed the possibility of whether or not further  evaluation with endoscopy is necessary.

## 2019-04-09 NOTE — Patient Instructions (Signed)

## 2019-04-11 ENCOUNTER — Encounter: Payer: Self-pay | Admitting: *Deleted

## 2019-06-26 ENCOUNTER — Emergency Department
Admission: EM | Admit: 2019-06-26 | Discharge: 2019-06-26 | Disposition: A | Discharge status: Home / Self Care | Payer: BC Managed Care – PPO | Attending: Emergency Medicine | Admitting: Emergency Medicine

## 2019-06-26 ENCOUNTER — Other Ambulatory Visit: Payer: Self-pay

## 2019-06-26 DIAGNOSIS — R112 Nausea with vomiting, unspecified: Secondary | ICD-10-CM | POA: Insufficient documentation

## 2019-06-26 DIAGNOSIS — I10 Essential (primary) hypertension: Secondary | ICD-10-CM | POA: Insufficient documentation

## 2019-06-26 DIAGNOSIS — Z87891 Personal history of nicotine dependence: Secondary | ICD-10-CM | POA: Diagnosis not present

## 2019-06-26 DIAGNOSIS — R61 Generalized hyperhidrosis: Secondary | ICD-10-CM | POA: Diagnosis not present

## 2019-06-26 LAB — CBC WITH DIFFERENTIAL/PLATELET
Abs Immature Granulocytes: 0.03 10*3/uL (ref 0.00–0.07)
Basophils Absolute: 0.1 10*3/uL (ref 0.0–0.1)
Basophils Relative: 1 %
Eosinophils Absolute: 0.4 10*3/uL (ref 0.0–0.5)
Eosinophils Relative: 3 %
HCT: 34.5 % — ABNORMAL LOW (ref 36.0–46.0)
Hemoglobin: 11.3 g/dL — ABNORMAL LOW (ref 12.0–15.0)
Immature Granulocytes: 0 %
Lymphocytes Relative: 21 %
Lymphs Abs: 2.2 10*3/uL (ref 0.7–4.0)
MCH: 28.5 pg (ref 26.0–34.0)
MCHC: 32.8 g/dL (ref 30.0–36.0)
MCV: 86.9 fL (ref 80.0–100.0)
Monocytes Absolute: 0.8 10*3/uL (ref 0.1–1.0)
Monocytes Relative: 7 %
Neutro Abs: 7 10*3/uL (ref 1.7–7.7)
Neutrophils Relative %: 68 %
Platelets: 340 10*3/uL (ref 150–400)
RBC: 3.97 MIL/uL (ref 3.87–5.11)
RDW: 12.9 % (ref 11.5–15.5)
WBC: 10.4 10*3/uL (ref 4.0–10.5)
nRBC: 0 % (ref 0.0–0.2)

## 2019-06-26 LAB — URINALYSIS, COMPLETE (UACMP) WITH MICROSCOPIC
Bacteria, UA: NONE SEEN
Bilirubin Urine: NEGATIVE
Glucose, UA: NEGATIVE mg/dL
Hgb urine dipstick: NEGATIVE
Ketones, ur: NEGATIVE mg/dL
Leukocytes,Ua: NEGATIVE
Nitrite: NEGATIVE
Protein, ur: NEGATIVE mg/dL
Specific Gravity, Urine: 1.015 (ref 1.005–1.030)
pH: 6 (ref 5.0–8.0)

## 2019-06-26 LAB — COMPREHENSIVE METABOLIC PANEL
ALT: 20 U/L (ref 0–44)
AST: 21 U/L (ref 15–41)
Albumin: 4.4 g/dL (ref 3.5–5.0)
Alkaline Phosphatase: 31 U/L — ABNORMAL LOW (ref 38–126)
Anion gap: 10 (ref 5–15)
BUN: 19 mg/dL (ref 6–20)
CO2: 26 mmol/L (ref 22–32)
Calcium: 9.4 mg/dL (ref 8.9–10.3)
Chloride: 105 mmol/L (ref 98–111)
Creatinine, Ser: 0.9 mg/dL (ref 0.44–1.00)
GFR calc Af Amer: >60 mL/min (ref >60)
GFR calc non Af Amer: >60 mL/min (ref >60)
Glucose, Bld: 121 mg/dL — ABNORMAL HIGH (ref 70–99)
Potassium: 4.5 mmol/L (ref 3.5–5.1)
Sodium: 141 mmol/L (ref 135–145)
Total Bilirubin: 0.4 mg/dL (ref 0.3–1.2)
Total Protein: 7.1 g/dL (ref 6.5–8.1)

## 2019-06-26 LAB — TSH: TSH: 2.164 u[IU]/mL (ref 0.350–4.500)

## 2019-06-26 LAB — LIPASE, BLOOD: Lipase: 28 U/L (ref 11–51)

## 2019-06-26 LAB — T4, FREE: Free T4: 0.83 ng/dL (ref 0.61–1.12)

## 2019-06-26 MED ORDER — ONDANSETRON HCL 4 MG/2ML IJ SOLN
4.0000 mg | Freq: Once | INTRAMUSCULAR | Status: AC
  Administered 2019-06-26: 4 mg via INTRAVENOUS
  Filled 2019-06-26: qty 2

## 2019-06-26 MED ORDER — SODIUM CHLORIDE 0.9 % IV SOLN
Freq: Once | INTRAVENOUS | Status: AC
  Administered 2019-06-26: 08:00:00 via INTRAVENOUS

## 2019-06-26 MED ORDER — ONDANSETRON 4 MG PO TBDP: 4.0000 mg | ORAL_TABLET | Freq: Three times a day (TID) | ORAL | 2 refills | Status: DC | PRN

## 2019-06-26 NOTE — ED Notes (Signed)
Pt up to given urine sample

## 2019-06-26 NOTE — ED Provider Notes (Signed)
[facility] Emergency Department Provider Note       Time seen: ----------------------------------------- 7:32 AM on 06/26/2019 -----------------------------------------   I have reviewed the triage vital signs and the nursing notes.  HISTORY   Chief Complaint Emesis    HPI Joan Stein is a 53 y.o. female with a history of allergies, chronic back pain, depression, GERD, hyperlipidemia, hypertension, insomnia who presents to the ED for nausea and vomiting with sweats for the past 4 weeks.  Patient states she is been vomiting every day in the past week.  She was seen by her primary care doctor and told it was GERD but states is getting worse.  She states she wakes up with sweats and also has sweats during the day.  She does not have any pain.  Past Medical History:  Diagnosis Date  . Allergy   . Chronic back pain   . Depression   . GERD (gastroesophageal reflux disease)   . Hyperlipidemia   . Hypertension   . Insomnia   . Reflux esophagitis   . Spondylosis     Patient Active Problem List   Diagnosis Date Noted  . DDD (degenerative disc disease), lumbar 10/05/2017  . Lumbar facet arthropathy (L4-5) (Bilateral) 10/05/2017  . Lumbar facet osteoarthritis 10/05/2017  . Osteoarthritis of lumbar spine 10/05/2017  . Chronic low back pain (Primary Area of Pain) (Bilateral) (L>R) 10/04/2017  . Chronic hip pain (Secondary Area of Pain) (Bilateral) (L>R) 10/04/2017  . Chronic sacroiliac joint pain (Bilateral) (L>R) 10/04/2017  . Lumbar facet syndrome (Bilateral) (L>R) 10/04/2017  . Chronic thoracic spine pain (Midline) 10/04/2017  . Chronic upper back (Midline) 10/04/2017  . Long term prescription opiate use 10/04/2017  . Opioid-induced constipation (OIC) 10/04/2017  . Neurogenic pain 10/04/2017  . Chronic musculoskeletal pain 10/04/2017  . Chronic pain syndrome 10/03/2017  . Disorder of skeletal system 10/03/2017  . Pharmacologic therapy 10/03/2017  .  Problems influencing health status 10/03/2017  . NSAID long-term use 10/03/2017  . Opiate use 10/03/2017    Past Surgical History:  Procedure Laterality Date  . ABDOMINAL HYSTERECTOMY    . BUNIONECTOMY    . TUBAL LIGATION      Allergies Patient has no known allergies.  Social History Social History   Tobacco Use  . Smoking status: Former Smoker    Types: Cigarettes  . Smokeless tobacco: Never Used  Substance Use Topics  . Alcohol use: No  . Drug use: No   Review of Systems Constitutional: Negative for fever. Cardiovascular: Negative for chest pain. Respiratory: Negative for shortness of breath. Gastrointestinal: Negative for abdominal pain, positive for nausea and vomiting Musculoskeletal: Negative for back pain. Skin: Positive for sweats Neurological: Negative for headaches, focal weakness or numbness.  All systems negative/normal/unremarkable except as stated in the HPI  ____________________________________________   PHYSICAL EXAM:  VITAL SIGNS: ED Triage Vitals [06/26/19 0728]  Enc Vitals Group     BP 138/83     Pulse Rate 96     Resp 16     Temp (!) 97.5 F (36.4 C)     Temp Source Oral     SpO2 100 %     Weight 150 lb (68 kg)     Height 5\' 2"  (1.575 m)     Head Circumference      Peak Flow      Pain Score 0     Pain Loc      Pain Edu?      Excl. in GC?  Constitutional: Alert and oriented. Well appearing and in no distress. Eyes: Conjunctivae are normal. Normal extraocular movements. Cardiovascular: Normal rate, regular rhythm. No murmurs, rubs, or gallops. Respiratory: Normal respiratory effort without tachypnea nor retractions. Breath sounds are clear and equal bilaterally. No wheezes/rales/rhonchi. Gastrointestinal: Soft and nontender. Normal bowel sounds Musculoskeletal: Nontender with normal range of motion in extremities. No lower extremity tenderness nor edema. Neurologic:  Normal speech and language. No gross focal neurologic deficits  are appreciated.  Skin:  Skin is warm, dry and intact. No rash noted. Psychiatric: Mood and affect are normal. Speech and behavior are normal.  ____________________________________________  ED COURSE:  As part of my medical decision making, I reviewed the following data within the [facility] History obtained from family if available, nursing notes, old chart and ekg, as well as notes from prior ED visits. Patient presented for nausea and vomiting, we will assess with labs and imaging as indicated at this time.   Procedures  Jobeth Pangilinan was evaluated in Emergency Department on 06/26/2019 for the symptoms described in the history of present illness. She was evaluated in the context of the global COVID-19 pandemic, which necessitated consideration that the patient might be at risk for infection with the SARS-CoV-2 virus that causes COVID-19. Institutional protocols and algorithms that pertain to the evaluation of patients at risk for COVID-19 are in a state of rapid change based on information released by regulatory bodies including the CDC and federal and state organizations. These policies and algorithms were followed during the patient's care in the ED.  ____________________________________________   LABS (pertinent positives/negatives)  Labs Reviewed  CBC WITH DIFFERENTIAL/PLATELET - Abnormal; Notable for the following components:      Result Value   Hemoglobin 11.3 (*)    HCT 34.5 (*)    All other components within normal limits  COMPREHENSIVE METABOLIC PANEL - Abnormal; Notable for the following components:   Glucose, Bld 121 (*)    Alkaline Phosphatase 31 (*)    All other components within normal limits  URINALYSIS, COMPLETE (UACMP) WITH MICROSCOPIC - Abnormal; Notable for the following components:   Color, Urine YELLOW (*)    APPearance CLEAR (*)    All other components within normal limits  LIPASE, BLOOD  T4, FREE  TSH    ____________________________________________   DIFFERENTIAL DIAGNOSIS   Gastroenteritis, GERD, withdrawal, electrolyte abnormality, dehydration, peptic ulcer disease  FINAL ASSESSMENT AND PLAN  Vomiting, diaphoresis   Plan: The patient had presented for persistent vomiting for several weeks. Patient's labs are reassuring.  He did not require any imaging during this visit.  I will place her on Zofran to take orally when she wakes up in the morning and should be referred to you GI for close outpatient follow-up.   Laurence Aly, MD    Note: This note was generated in part or whole with voice recognition software. Voice recognition is usually quite accurate but there are transcription errors that can and very often do occur. I apologize for any typographical errors that were not detected and corrected.     Earleen Newport, MD 06/26/19 1030

## 2019-06-26 NOTE — ED Triage Notes (Signed)
Pt c/o N/V with sweats for the past 4 weeks, states vomiting everyday in the past week. States she was seen by her PCP and told it was acid reflux but states it is getting worse.

## 2019-06-27 ENCOUNTER — Other Ambulatory Visit: Payer: Self-pay | Admitting: Gastroenterology

## 2019-06-27 ENCOUNTER — Other Ambulatory Visit (HOSPITAL_COMMUNITY): Payer: Self-pay | Admitting: Gastroenterology

## 2019-06-27 DIAGNOSIS — R112 Nausea with vomiting, unspecified: Secondary | ICD-10-CM

## 2019-06-27 DIAGNOSIS — R1310 Dysphagia, unspecified: Secondary | ICD-10-CM

## 2019-06-27 DIAGNOSIS — R634 Abnormal weight loss: Secondary | ICD-10-CM

## 2019-06-27 DIAGNOSIS — R61 Generalized hyperhidrosis: Secondary | ICD-10-CM

## 2019-06-27 DIAGNOSIS — R072 Precordial pain: Secondary | ICD-10-CM

## 2019-06-27 DIAGNOSIS — R6881 Early satiety: Secondary | ICD-10-CM

## 2019-06-27 DIAGNOSIS — R1319 Other dysphagia: Secondary | ICD-10-CM

## 2019-06-27 DIAGNOSIS — R1013 Epigastric pain: Secondary | ICD-10-CM

## 2019-07-01 ENCOUNTER — Other Ambulatory Visit: Payer: Self-pay

## 2019-07-01 ENCOUNTER — Ambulatory Visit
Admission: RE | Admit: 2019-07-01 | Discharge: 2019-07-01 | Disposition: A | Discharge status: Home / Self Care | Payer: BC Managed Care – PPO | Source: Ambulatory Visit | Attending: Gastroenterology | Admitting: Gastroenterology

## 2019-07-01 DIAGNOSIS — R072 Precordial pain: Secondary | ICD-10-CM | POA: Diagnosis present

## 2019-07-01 DIAGNOSIS — R6881 Early satiety: Secondary | ICD-10-CM

## 2019-07-01 DIAGNOSIS — R112 Nausea with vomiting, unspecified: Secondary | ICD-10-CM | POA: Diagnosis present

## 2019-07-01 DIAGNOSIS — R1319 Other dysphagia: Secondary | ICD-10-CM

## 2019-07-01 DIAGNOSIS — R1013 Epigastric pain: Secondary | ICD-10-CM

## 2019-07-01 DIAGNOSIS — R1310 Dysphagia, unspecified: Secondary | ICD-10-CM

## 2019-07-01 DIAGNOSIS — R634 Abnormal weight loss: Secondary | ICD-10-CM | POA: Diagnosis present

## 2019-07-01 DIAGNOSIS — R61 Generalized hyperhidrosis: Secondary | ICD-10-CM

## 2019-07-08 ENCOUNTER — Other Ambulatory Visit
Admission: RE | Admit: 2019-07-08 | Discharge: 2019-07-08 | Disposition: A | Discharge status: Home / Self Care | Payer: BC Managed Care – PPO | Source: Ambulatory Visit | Attending: Internal Medicine | Admitting: Internal Medicine

## 2019-07-08 ENCOUNTER — Other Ambulatory Visit: Payer: Self-pay

## 2019-07-08 DIAGNOSIS — Z01812 Encounter for preprocedural laboratory examination: Secondary | ICD-10-CM | POA: Insufficient documentation

## 2019-07-08 DIAGNOSIS — Z20828 Contact with and (suspected) exposure to other viral communicable diseases: Secondary | ICD-10-CM | POA: Diagnosis not present

## 2019-07-08 LAB — SARS CORONAVIRUS 2 (TAT 6-24 HRS): SARS Coronavirus 2: NEGATIVE

## 2019-10-09 ENCOUNTER — Ambulatory Visit: Payer: BC Managed Care – PPO | Admitting: Family Medicine

## 2019-10-11 IMAGING — MR MR LUMBAR SPINE W/O CM
4 of 5 series · 25 of 48 positions shown · non-contrast
Comparison: Lumbar spine radiographs 08/10/2017

CLINICAL DATA: Chronic low back pain with left leg and hip pain.
Leg weakness and numbness.

EXAM:
MRI LUMBAR SPINE WITHOUT CONTRAST
TECHNIQUE: Multiplanar, multisequence MR imaging of the lumbar spine was
performed. No intravenous contrast was administered.

[Series 2: T2 · sagittal · 4.0mm · 0.81mm/px · 6 of 15 slices shown (1 of 2)]
[im 1/15]
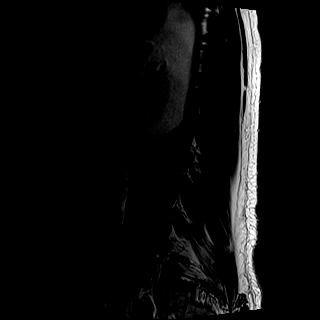
[im 3/15]
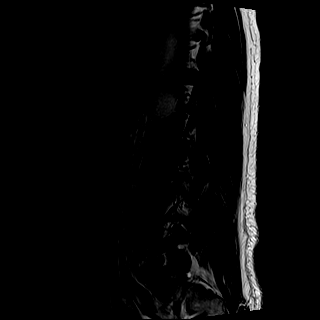
[im 6/15]
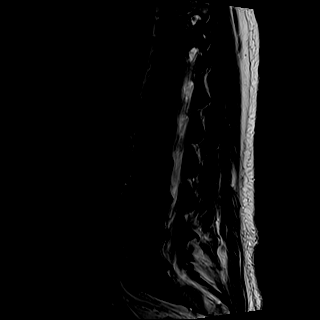
[im 9/15]
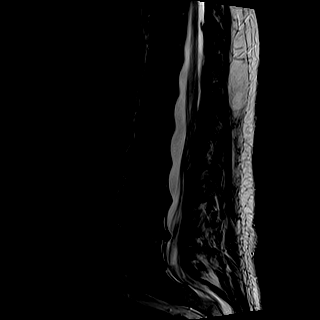
[im 12/15]
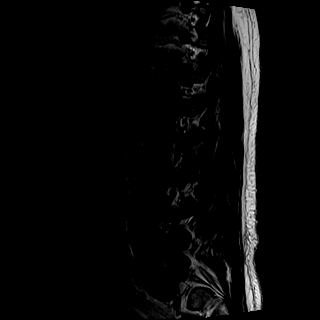
[im 15/15]
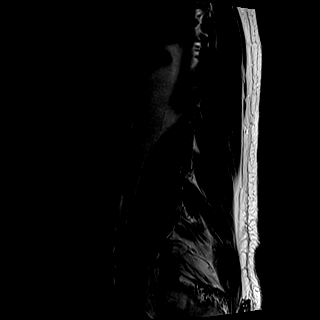

[Series 3: T1 · sagittal · 4.0mm · 0.41mm/px · 6 of 15 slices shown (1 of 2)]
[im 1/15]
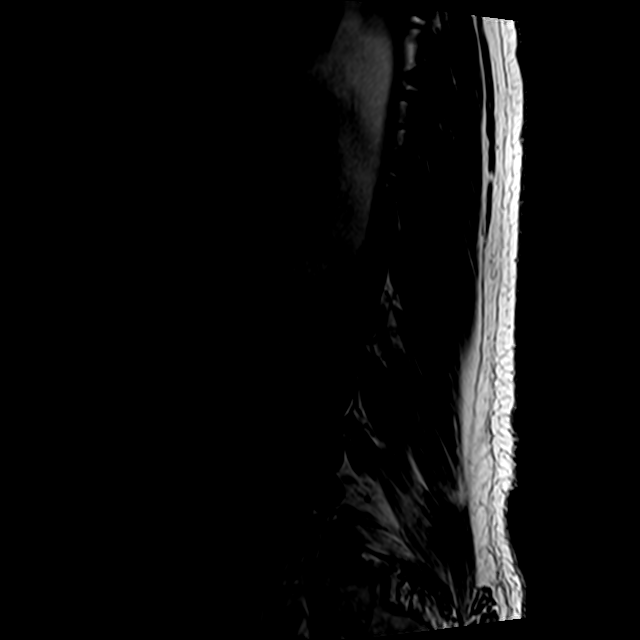
[im 3/15]
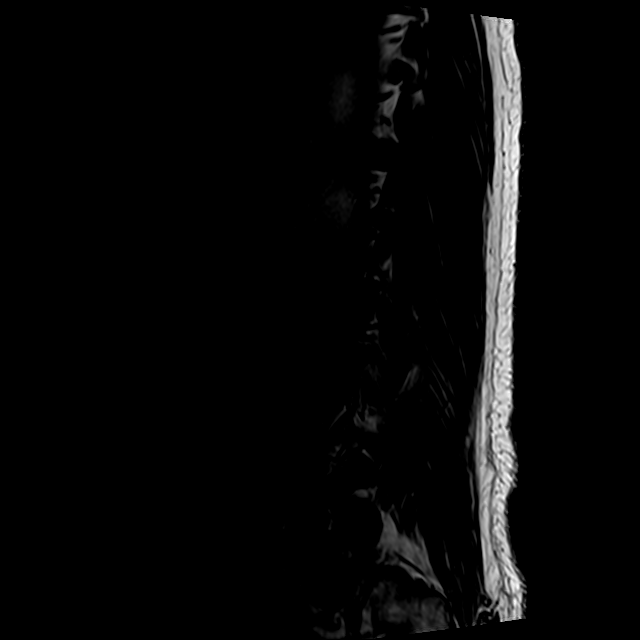
[im 6/15]
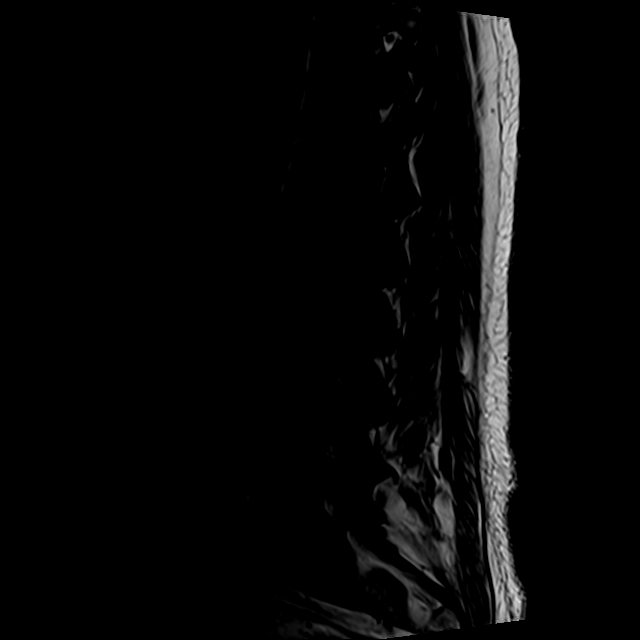
[im 9/15]
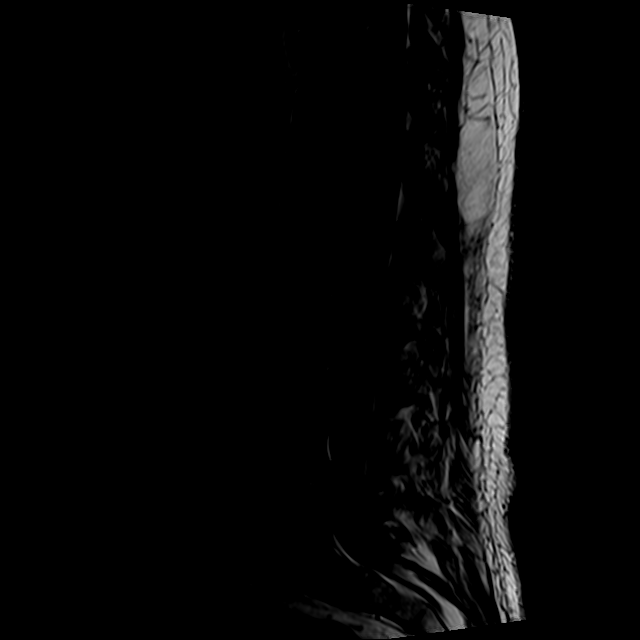
[im 12/15]
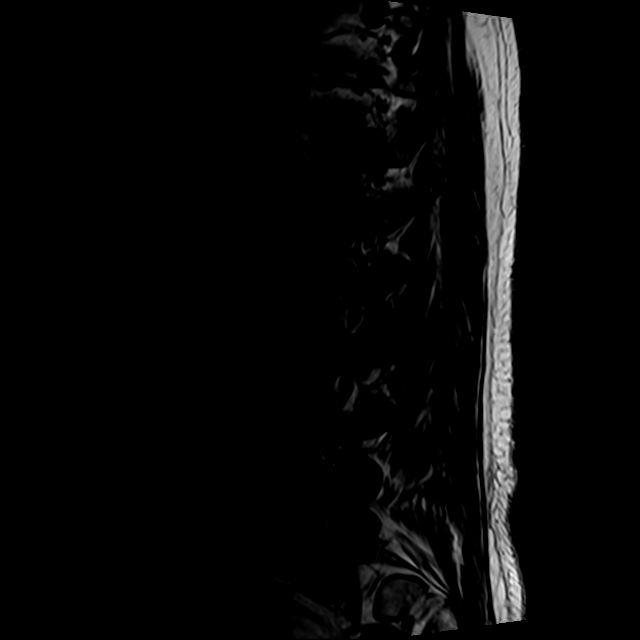
[im 15/15]
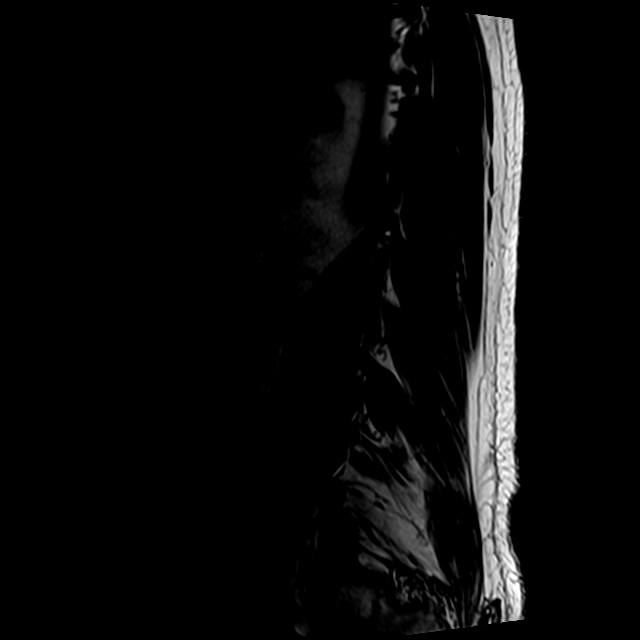

[Series 5: T2 · axial · 4.0mm · 0.78mm/px · z∈[-42,+143]mm · 9 of 36 slices shown (2 of 2)]
[im 1/36]
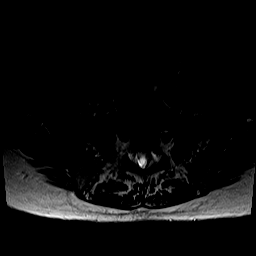
[im 6/36]
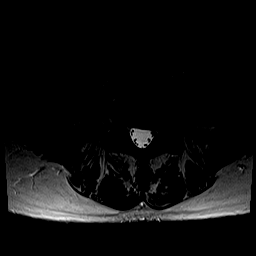
[im 11/36]
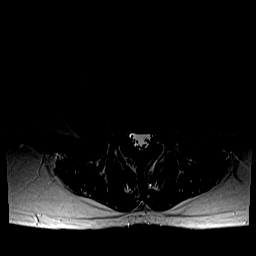
[im 16/36]
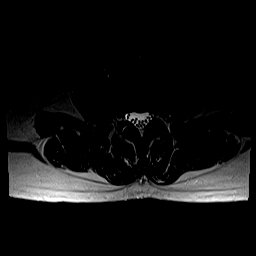
[im 18/36]
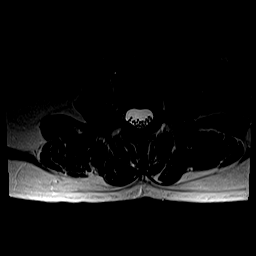
[im 21/36]
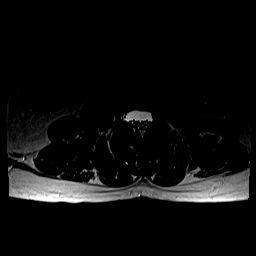
[im 26/36]
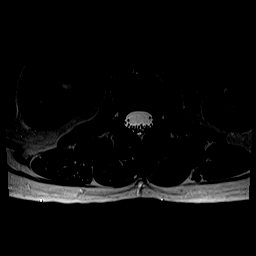
[im 31/36]
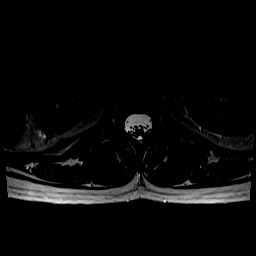
[im 36/36]
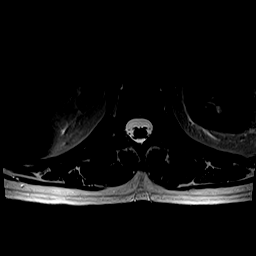

[Series 6: T1 · axial · 4.0mm · 0.39mm/px · z∈[-42,+118]mm · 4 of 36 slices shown (2 of 2)]
[im 1/36]
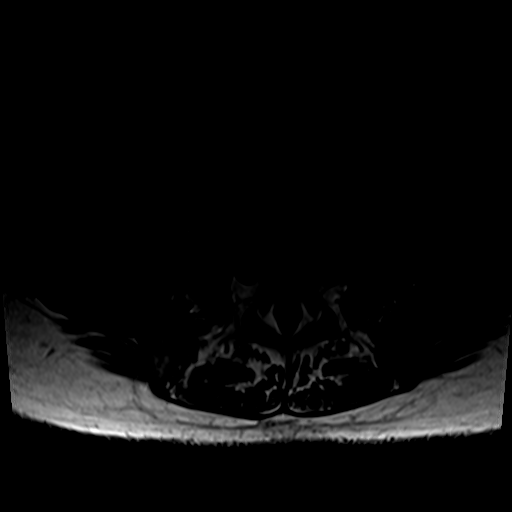
[im 6/36]
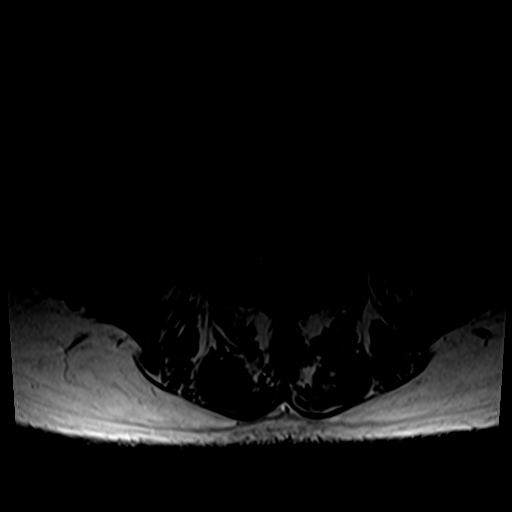
[im 18/36]
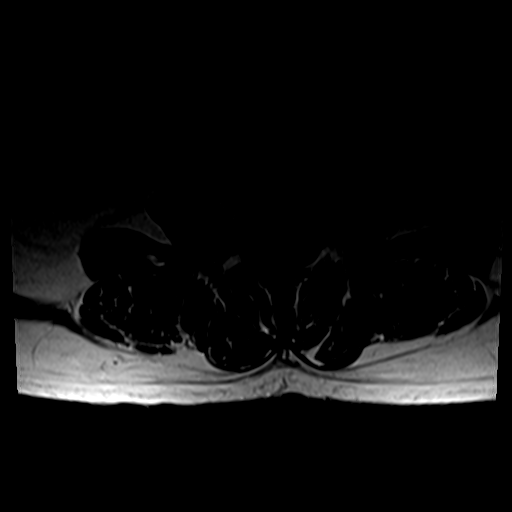
[im 31/36]
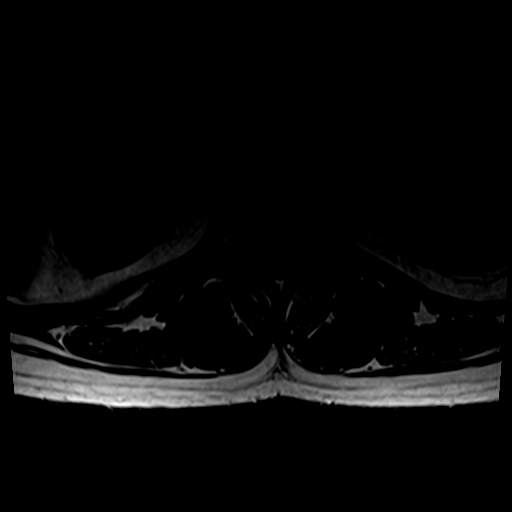

[25 of 48 positions shown; findings below may reference images not displayed]

FINDINGS: Segmentation:  Standard.

Alignment: Straightening of the normal lumbar lordosis. Minimal
retrolisthesis of L4 on L5.

Vertebrae: No fracture, suspicious osseous lesion, or significant
marrow edema. Degenerative endplate changes at L3-4 and L4-5. Small
L4 superior endplate Schmorl's node anteriorly.

Conus medullaris: Extends to the L1-2 level and appears normal.

Paraspinal and other soft tissues: Unremarkable.

Disc levels:

Disc desiccation greatest at L3-4 and L4-5 where there is severe
disc space narrowing.

T12-L1:  Minimal endplate spurring without stenosis.

L1-2:  Minimal disc bulging and spurring without stenosis.

L2-3: Mild disc bulging, mild endplate spurring, and mild facet
arthrosis without stenosis. Noncompressive 4 mm synovial cyst medial
to the right facet joint, within/deep to the ligament.

L3-4: Disc bulging slightly greater to the left results in mild left
neural foraminal stenosis without spinal stenosis.

L4-5: Circumferential disc bulging, broad central disc protrusion,
and mild facet arthrosis result in moderate bilateral lateral recess
stenosis and mild bilateral neural foraminal stenosis without
significant spinal stenosis. Potential L5 nerve root impingement in
the lateral recesses.

L5-S1: Disc bulging, left subarticular to foraminal disc protrusion,
and mild facet arthrosis result in mild right and moderate left
neural foraminal stenosis with potential left L5 nerve root
impingement. There is at most minimal left lateral recess narrowing.
No spinal stenosis.
IMPRESSION: 1. Lower lumbar disc degeneration, worst at L4-5 where there is
moderate bilateral lateral recess and mild neural foraminal
stenosis. Potential L5 nerve root impingement.
2. Moderate left neural foraminal stenosis at L5-S1.

## 2019-12-23 ENCOUNTER — Other Ambulatory Visit: Payer: Self-pay

## 2019-12-23 DIAGNOSIS — I10 Essential (primary) hypertension: Secondary | ICD-10-CM

## 2019-12-23 DIAGNOSIS — E785 Hyperlipidemia, unspecified: Secondary | ICD-10-CM

## 2019-12-23 MED ORDER — FENOFIBRATE 160 MG PO TABS: ORAL_TABLET | ORAL | 0 refills | Status: DC

## 2019-12-23 MED ORDER — LOSARTAN POTASSIUM 50 MG PO TABS: 50.0000 mg | ORAL_TABLET | Freq: Every day | ORAL | 0 refills | Status: DC

## 2020-03-11 ENCOUNTER — Other Ambulatory Visit: Payer: Self-pay

## 2020-03-11 ENCOUNTER — Encounter: Payer: Self-pay | Admitting: Family Medicine

## 2020-03-11 ENCOUNTER — Ambulatory Visit (INDEPENDENT_AMBULATORY_CARE_PROVIDER_SITE_OTHER): Payer: PRIVATE HEALTH INSURANCE | Admitting: Family Medicine

## 2020-03-11 VITALS — BP 138/88 | HR 72 | Ht 62.0 in | Wt 156.0 lb

## 2020-03-11 DIAGNOSIS — G8929 Other chronic pain: Secondary | ICD-10-CM

## 2020-03-11 DIAGNOSIS — K219 Gastro-esophageal reflux disease without esophagitis: Secondary | ICD-10-CM | POA: Diagnosis not present

## 2020-03-11 DIAGNOSIS — I10 Essential (primary) hypertension: Secondary | ICD-10-CM | POA: Diagnosis not present

## 2020-03-11 DIAGNOSIS — M545 Low back pain, unspecified: Secondary | ICD-10-CM

## 2020-03-11 DIAGNOSIS — Z1211 Encounter for screening for malignant neoplasm of colon: Secondary | ICD-10-CM

## 2020-03-11 DIAGNOSIS — F32A Depression, unspecified: Secondary | ICD-10-CM | POA: Insufficient documentation

## 2020-03-11 DIAGNOSIS — F329 Major depressive disorder, single episode, unspecified: Secondary | ICD-10-CM | POA: Diagnosis not present

## 2020-03-11 DIAGNOSIS — E785 Hyperlipidemia, unspecified: Secondary | ICD-10-CM | POA: Diagnosis not present

## 2020-03-11 DIAGNOSIS — M47816 Spondylosis without myelopathy or radiculopathy, lumbar region: Secondary | ICD-10-CM

## 2020-03-11 DIAGNOSIS — R1310 Dysphagia, unspecified: Secondary | ICD-10-CM

## 2020-03-11 LAB — POCT URINALYSIS DIPSTICK
Bilirubin, UA: NEGATIVE
Blood, UA: NEGATIVE
Glucose, UA: NEGATIVE
Ketones, UA: NEGATIVE
Leukocytes, UA: NEGATIVE
Nitrite, UA: NEGATIVE
Protein, UA: NEGATIVE
Spec Grav, UA: <=1.005 — AB (ref 1.010–1.025)
Urobilinogen, UA: 0.2 E.U./dL
pH, UA: 8 (ref 5.0–8.0)

## 2020-03-11 MED ORDER — PANTOPRAZOLE SODIUM 40 MG PO TBEC: 40.0000 mg | DELAYED_RELEASE_TABLET | Freq: Every day | ORAL | 5 refills | Status: AC

## 2020-03-11 MED ORDER — FENOFIBRATE 160 MG PO TABS: ORAL_TABLET | ORAL | 5 refills | Status: AC

## 2020-03-11 MED ORDER — LOSARTAN POTASSIUM 50 MG PO TABS: 50.0000 mg | ORAL_TABLET | Freq: Every day | ORAL | 5 refills | Status: AC

## 2020-03-11 MED ORDER — BUPROPION HCL ER (SR) 150 MG PO TB12: ORAL_TABLET | ORAL | 5 refills | Status: AC

## 2020-03-11 NOTE — Progress Notes (Signed)
Date:  03/11/2020   Name:  Joan Stein   DOB:  1966-04-27   MRN:  [id number]   Chief Complaint: Depression, Gastroesophageal Reflux, Hyperlipidemia, Hypertension, and Colonoscopy (referral to GI)  Depression        This is a chronic problem.  The current episode started more than 1 year ago.   The onset quality is undetermined.   The problem has been waxing and waning since onset.  Associated symptoms include helplessness, hopelessness, decreased interest, myalgias and sad.  Associated symptoms include no decreased concentration, no fatigue, does not have insomnia, not irritable, no restlessness, no appetite change, no body aches, no headaches, no indigestion and no suicidal ideas.  Past treatments include other medications.  Compliance with treatment is good.   Pertinent negatives include no anxiety. Gastroesophageal Reflux She complains of dysphagia. She reports no abdominal pain, no belching, no chest pain, no choking, no coughing, no early satiety, no globus sensation, no heartburn, no hoarse voice, no nausea, no sore throat, no stridor, no tooth decay, no water brash or no wheezing. This is a chronic problem. The current episode started more than 1 year ago. The problem occurs constantly. The problem has been gradually improving. The symptoms are aggravated by certain foods. Pertinent negatives include no anemia, fatigue, melena, muscle weakness, orthopnea or weight loss. She has tried a PPI for the symptoms. The treatment provided mild relief.  Hyperlipidemia This is a chronic problem. The current episode started more than 1 year ago. Recent lipid tests were reviewed and are normal. Associated symptoms include myalgias. Pertinent negatives include no chest pain or shortness of breath.  Hypertension This is a chronic problem. The current episode started more than 1 year ago. The problem is controlled. Pertinent negatives include no anxiety, blurred vision, chest pain, headaches, neck pain,  palpitations, peripheral edema, PND, shortness of breath or sweats. Past treatments include angiotensin blockers. The current treatment provides mild improvement. There is no history of angina, kidney disease, CAD/MI, CVA, heart failure, left ventricular hypertrophy, PVD or retinopathy.  Back Pain This is a chronic problem. The current episode started more than 1 year ago. The problem occurs constantly. The problem has been waxing and waning since onset. The pain is present in the lumbar spine and thoracic spine. The quality of the pain is described as aching. The pain radiates to the left thigh, left knee and left foot. The pain is at a severity of 5/10. The symptoms are aggravated by bending and twisting. Associated symptoms include numbness and tingling. Pertinent negatives include no abdominal pain, bladder incontinence, bowel incontinence, chest pain, dysuria, fever, headaches, pelvic pain, weakness or weight loss.  GI Problem The primary symptoms include myalgias. Primary symptoms do not include fever, weight loss, fatigue, abdominal pain, nausea, vomiting, diarrhea, melena, hematochezia, dysuria, arthralgias or rash.  The myalgias are not associated with weakness.  The illness is also significant for dysphagia and back pain. The illness does not include chills or constipation. Significant associated medical issues include GERD.    Lab Results  Component Value Date   CREATININE 0.90 06/26/2019   BUN 19 06/26/2019   NA 141 06/26/2019   K 4.5 06/26/2019   CL 105 06/26/2019   CO2 26 06/26/2019   Lab Results  Component Value Date   CHOL 240 (H) 08/10/2017   HDL 44 08/10/2017   LDLCALC 166 (H) 08/10/2017   TRIG 148 08/10/2017   CHOLHDL 5.5 (H) 08/10/2017   Lab Results  Component Value Date  TSH 2.164 06/26/2019   No results found for: HGBA1C Lab Results  Component Value Date   WBC 10.4 06/26/2019   HGB 11.3 (L) 06/26/2019   HCT 34.5 (L) 06/26/2019   MCV 86.9 06/26/2019   PLT  340 06/26/2019   Lab Results  Component Value Date   ALT 20 06/26/2019   AST 21 06/26/2019   ALKPHOS 31 (L) 06/26/2019   BILITOT 0.4 06/26/2019     Review of Systems  Constitutional: Negative.  Negative for appetite change, chills, fatigue, fever, unexpected weight change and weight loss.  HENT: Negative for congestion, ear discharge, ear pain, hoarse voice, postnasal drip, rhinorrhea, sinus pressure, sneezing and sore throat.   Eyes: Negative for blurred vision, photophobia, pain, discharge, redness and itching.  Respiratory: Negative for cough, choking, shortness of breath, wheezing and stridor.   Cardiovascular: Negative for chest pain, palpitations and PND.  Gastrointestinal: Positive for dysphagia. Negative for abdominal pain, blood in stool, bowel incontinence, constipation, diarrhea, heartburn, hematochezia, melena, nausea and vomiting.  Endocrine: Negative for cold intolerance, heat intolerance, polydipsia, polyphagia and polyuria.  Genitourinary: Negative for bladder incontinence, dysuria, flank pain, frequency, hematuria, menstrual problem, pelvic pain, urgency, vaginal bleeding and vaginal discharge.  Musculoskeletal: Positive for back pain and myalgias. Negative for arthralgias, muscle weakness and neck pain.  Skin: Negative for rash.  Allergic/Immunologic: Negative for environmental allergies and food allergies.  Neurological: Positive for tingling and numbness. Negative for dizziness, weakness, light-headedness and headaches.  Hematological: Negative for adenopathy. Does not bruise/bleed easily.  Psychiatric/Behavioral: Positive for depression. Negative for decreased concentration, dysphoric mood and suicidal ideas. The patient is not nervous/anxious and does not have insomnia.     Patient Active Problem List   Diagnosis Date Noted  . DDD (degenerative disc disease), lumbar 10/05/2017  . Lumbar facet arthropathy (L4-5) (Bilateral) 10/05/2017  . Lumbar facet  osteoarthritis 10/05/2017  . Osteoarthritis of lumbar spine 10/05/2017  . Chronic low back pain (Primary Area of Pain) (Bilateral) (L>R) 10/04/2017  . Chronic hip pain (Secondary Area of Pain) (Bilateral) (L>R) 10/04/2017  . Chronic sacroiliac joint pain (Bilateral) (L>R) 10/04/2017  . Lumbar facet syndrome (Bilateral) (L>R) 10/04/2017  . Chronic thoracic spine pain (Midline) 10/04/2017  . Chronic upper back (Midline) 10/04/2017  . Long term prescription opiate use 10/04/2017  . Opioid-induced constipation (OIC) 10/04/2017  . Neurogenic pain 10/04/2017  . Chronic musculoskeletal pain 10/04/2017  . Chronic pain syndrome 10/03/2017  . Disorder of skeletal system 10/03/2017  . Pharmacologic therapy 10/03/2017  . Problems influencing health status 10/03/2017  . NSAID long-term use 10/03/2017  . Opiate use 10/03/2017    No Known Allergies  Past Surgical History:  Procedure Laterality Date  . ABDOMINAL HYSTERECTOMY    . BUNIONECTOMY    . TUBAL LIGATION      Social History   Tobacco Use  . Smoking status: Former Smoker    Types: Cigarettes  . Smokeless tobacco: Never Used  Substance Use Topics  . Alcohol use: No  . Drug use: No     Medication list has been reviewed and updated.  Current Meds  Medication Sig  . buPROPion (WELLBUTRIN SR) 150 MG 12 hr tablet TAKE 1 TABLET(150 MG) BY MOUTH TWICE DAILY  . cetirizine (ZYRTEC) 10 MG tablet Take 10 mg by mouth daily.  . fenofibrate 160 MG tablet TAKE 1 TABLET(160 MG) BY MOUTH DAILY  . losartan (COZAAR) 50 MG tablet Take 1 tablet (50 mg total) by mouth daily.  . pantoprazole (PROTONIX) 40 MG tablet  Take 1 tablet (40 mg total) by mouth daily.    PHQ 2/9 Scores 03/11/2020 04/09/2019 03/20/2019 10/04/2017  PHQ - 2 Score 2 1 4  0  PHQ- 9 Score 2 8 9  -    BP Readings from Last 3 Encounters:  03/11/20 138/88  06/26/19 (!) 139/91  04/09/19 130/80    Physical Exam Vitals and nursing note reviewed.  Constitutional:      General:  She is not irritable.She is not in acute distress.    Appearance: She is not diaphoretic.  HENT:     Head: Normocephalic and atraumatic.     Right Ear: Tympanic membrane, ear canal and external ear normal. There is no impacted cerumen.     Left Ear: Tympanic membrane, ear canal and external ear normal. There is no impacted cerumen.     Nose: Nose normal. No congestion or rhinorrhea.     Mouth/Throat:     Mouth: Mucous membranes are moist.  Eyes:     General:        Right eye: No discharge.        Left eye: No discharge.     Conjunctiva/sclera: Conjunctivae normal.     Pupils: Pupils are equal, round, and reactive to light.  Neck:     Thyroid: No thyromegaly.     Vascular: No JVD.  Cardiovascular:     Rate and Rhythm: Normal rate and regular rhythm.     Heart sounds: Normal heart sounds. No murmur. No friction rub. No gallop.   Pulmonary:     Effort: Pulmonary effort is normal.     Breath sounds: Normal breath sounds.  Abdominal:     General: Bowel sounds are normal.     Palpations: Abdomen is soft. There is no mass.     Tenderness: There is no abdominal tenderness. There is no guarding.  Musculoskeletal:        General: Normal range of motion.     Cervical back: Normal range of motion and neck supple.  Lymphadenopathy:     Cervical: No cervical adenopathy.  Skin:    General: Skin is warm and dry.  Neurological:     Mental Status: She is alert.     Deep Tendon Reflexes: Reflexes are normal and symmetric.     Wt Readings from Last 3 Encounters:  03/11/20 156 lb (70.8 kg)  06/26/19 150 lb (68 kg)  04/09/19 155 lb (70.3 kg)    BP 138/88   Pulse 72   Ht 5\' 2"  (1.575 m)   Wt 156 lb (70.8 kg)   BMI 28.53 kg/m   Assessment and Plan: 1. Depression, unspecified depression type Chronic.  Partially controlled on medication.  Stable.  Patient has significant issues with chronic pain which is affected her overall wellbeing.  This is constant.  In the past partially  controlled she will continue bupropion 150 mg every 12 hours. - buPROPion (WELLBUTRIN SR) 150 MG 12 hr tablet; TAKE 1 TABLET(150 MG) BY MOUTH TWICE DAILY  Dispense: 60 tablet; Refill: 5  2. Hyperlipidemia, unspecified hyperlipidemia type Chronic.  Controlled.  Stable.  Continue fenofibrate 160 mg daily. - fenofibrate 160 MG tablet; TAKE 1 TABLET(160 MG) BY MOUTH DAILY  Dispense: 30 tablet; Refill: 5  3. Essential hypertension Chronic.  Controlled.  Stable.  Continue losartan 50 mg once a day.  Will be checking renal function panel on a day that she returns fasting. - losartan (COZAAR) 50 MG tablet; Take 1 tablet (50 mg total) by mouth daily.  Dispense: 30 tablet; Refill: 5  4. Gastroesophageal reflux disease Chronic.  Relatively controlled.  The patient is taking her medication episodically.  Gust with the patient that it would be best to take pantoprazole 40 mg once a day on a regular basis given her issues that she is having with dysphagia. - pantoprazole (PROTONIX) 40 MG tablet; Take 1 tablet (40 mg total) by mouth daily.  Dispense: 30 tablet; Refill: 5  5. Dysphagia, unspecified type New problem.  Episodic.  Is related to use of NSAIDs and certain foods.  Discussed and referral made to gastroenterology for evaluation.  Patient will be evaluated for colon cancer screening and this will be brought up at that time and will be discussed for possible further eval instigation. - Ambulatory referral to Gastroenterology  6. Colon cancer screening Discussed and referral made to gastroenterology for screening purposes. - Ambulatory referral to Gastroenterology  7. Acute bilateral low back pain without sciatica Patient's had a little more pain in her lower back from her chronic back pain and she has concerns about kidney issues.  Urinalysis is unremarkable for infection and blood. - POCT Urinalysis Dipstick  8. Lumbar facet arthropathy (L4-5) (Bilateral) Review of lower back lumbar x-rays notes  arthropathy and issues at L4-L5 and S1 L5. Will refer to orthopedics for reevaluation because patient has had injections in the past and is unable to take NSAIDs on a regular basis due to her GI concerns.  9. Chronic low back pain (Primary Area of Pain) (Bilateral) (L>R) Review of patient's MRI in 2018 also notes that there is disc and degenerative changes thereof and we will refer to orthopedics for evaluation and possible need for other interventions.

## 2020-07-22 ENCOUNTER — Encounter: Payer: Self-pay | Admitting: Anesthesiology

## 2020-07-22 ENCOUNTER — Encounter: Admission: RE | Payer: Self-pay | Source: Home / Self Care

## 2020-07-22 ENCOUNTER — Ambulatory Visit
Admission: RE | Admit: 2020-07-22 | Payer: PRIVATE HEALTH INSURANCE | Source: Home / Self Care | Admitting: Internal Medicine

## 2020-07-22 SURGERY — COLONOSCOPY WITH PROPOFOL
Anesthesia: General
